# Patient Record
Sex: Male | Born: 2000 | Race: Black or African American | Hispanic: No | Marital: Single | Smoking: Former smoker
Health system: Southern US, Community
[De-identification: ages and names within clinical notes are randomized; demographics above are authoritative.]

## PROBLEM LIST (undated history)

## (undated) DIAGNOSIS — K219 Gastro-esophageal reflux disease without esophagitis: Secondary | ICD-10-CM

## (undated) DIAGNOSIS — T7840XA Allergy, unspecified, initial encounter: Secondary | ICD-10-CM

## (undated) DIAGNOSIS — H6690 Otitis media, unspecified, unspecified ear: Secondary | ICD-10-CM

## (undated) DIAGNOSIS — J45909 Unspecified asthma, uncomplicated: Secondary | ICD-10-CM

## (undated) DIAGNOSIS — J302 Other seasonal allergic rhinitis: Secondary | ICD-10-CM

## (undated) DIAGNOSIS — J189 Pneumonia, unspecified organism: Secondary | ICD-10-CM

## (undated) HISTORY — DX: Allergy, unspecified, initial encounter: T78.40XA

## (undated) HISTORY — DX: Pneumonia, unspecified organism: J18.9

## (undated) HISTORY — DX: Unspecified asthma, uncomplicated: J45.909

## (undated) HISTORY — PX: CIRCUMCISION: SUR203

## (undated) HISTORY — PX: TONSILLECTOMY AND ADENOIDECTOMY: SHX28

## (undated) HISTORY — DX: Gastro-esophageal reflux disease without esophagitis: K21.9

## (undated) HISTORY — PX: TONSILLECTOMY: SUR1361

## (undated) HISTORY — DX: Other seasonal allergic rhinitis: J30.2

## (undated) HISTORY — DX: Otitis media, unspecified, unspecified ear: H66.90

---
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
## 2021-01-11 ENCOUNTER — Other Ambulatory Visit: Payer: Self-pay

## 2021-01-11 ENCOUNTER — Ambulatory Visit (INDEPENDENT_AMBULATORY_CARE_PROVIDER_SITE_OTHER): Payer: Medicaid Other | Admitting: Pediatrics

## 2021-01-11 ENCOUNTER — Encounter: Payer: Self-pay | Admitting: Pediatrics

## 2021-01-11 ENCOUNTER — Other Ambulatory Visit (HOSPITAL_COMMUNITY)
Admission: RE | Admit: 2021-01-11 | Discharge: 2021-01-11 | Disposition: A | Discharge status: Home / Self Care | Payer: Medicaid Other | Source: Ambulatory Visit | Attending: Pediatrics | Admitting: Pediatrics

## 2021-01-11 VITALS — BP 118/70 | HR 87 | Ht 72.0 in | Wt 247.2 lb

## 2021-01-11 DIAGNOSIS — E6609 Other obesity due to excess calories: Secondary | ICD-10-CM

## 2021-01-11 DIAGNOSIS — Z114 Encounter for screening for human immunodeficiency virus [HIV]: Secondary | ICD-10-CM | POA: Diagnosis not present

## 2021-01-11 DIAGNOSIS — F310 Bipolar disorder, current episode hypomanic: Secondary | ICD-10-CM | POA: Diagnosis not present

## 2021-01-11 DIAGNOSIS — J4520 Mild intermittent asthma, uncomplicated: Secondary | ICD-10-CM

## 2021-01-11 DIAGNOSIS — Z113 Encounter for screening for infections with a predominantly sexual mode of transmission: Secondary | ICD-10-CM | POA: Diagnosis present

## 2021-01-11 DIAGNOSIS — Z00121 Encounter for routine child health examination with abnormal findings: Secondary | ICD-10-CM

## 2021-01-11 DIAGNOSIS — Z68.41 Body mass index (BMI) pediatric, greater than or equal to 95th percentile for age: Secondary | ICD-10-CM

## 2021-01-11 LAB — POCT RAPID HIV: Rapid HIV, POC: NEGATIVE

## 2021-01-11 NOTE — Progress Notes (Signed)
Adolescent Well Care Visit Tom [name] is a 20 y.o. male who is here for well care.     PCP:  Lady Deutscher, MD   History was provided by the patient and mother.  Confidentiality was discussed with the patient and, if applicable, with caregiver.   Current Issues: Current concerns include  Weaning off abilify. Now on 5mg  (down from 10mg ); plan to come off as of aug 13. Still feeling very "clouded". Does not want to do therapy again.  Nutrition: Nutrition/Eating Behaviors: wide variety, sits at home a lot Adequate calcium in diet?: yes  Exercise/ Media: Play any Sports?:  football Exercise:  goes to gym ) Screen Time:  > 2 hours-counseling provided  Sleep:  Sleep: 10 hours  Social Screening: Lives with:  mom, brother Parental relations:   good Activities, Work, and ?: none currently, considering working at [employer] Concerns regarding behavior with peers?  no  Education: School Grade: finished  Patient has a dental home: yes   Confidential social history: Tobacco?  no Secondhand smoke exposure? no Drugs/ETOH?  no  Sexually Active?  no   Pregnancy Prevention: n/a  Safe at home, in school & in relationships? yes Safe to self?  Yes   Screenings:  The patient completed the Rapid Assessment for Adolescent Preventive Services screening questionnaire and the following topics were identified as risk factors and discussed: healthy eating, exercise, drug use, and condom use  In addition, the following topics were discussed as part of anticipatory guidance: pregnancy prevention, depression/anxiety.  PHQ-9 completed and results indicated 4  Physical Exam:  Vitals:   01/11/21 1337  BP: 118/70  Pulse: 87  SpO2: 99%  Weight: 247 lb 3.2 oz (112.1 kg)  Height: 6' (1.829 m)   BP 118/70 (BP Location: Right Arm, Patient Position: Sitting, Cuff Size: Normal)   Pulse 87   Ht 6' (1.829 m)   Wt 247 lb 3.2 oz (112.1 kg)   SpO2 99%   BMI 33.53 kg/m   Body mass index: body mass index is 33.53 kg/m. Blood pressure percentiles are not available for patients who are 18 years or older.  Hearing Screening  Method: Audiometry   500Hz  1000Hz  2000Hz  4000Hz   Right ear 20 20 20 20   Left ear 20 20 20 20    Vision Screening   Right eye Left eye Both eyes  Without correction 20/25 20/25 20/20   With correction       General: well developed, no acute distress, gait normal HEENT: PERRL, normal oropharynx, TMs normal bilaterally Neck: supple, no lymphadenopathy CV: RRR no murmur noted PULM: normal aeration throughout all lung fields, no crackles or wheezes Abdomen: soft, non-tender; no masses or HSM Extremities: warm and well perfused Skin: no rash Neuro: alert and oriented, moves all extremities equally   Assessment and Plan:  Tom [name] is a 20 y.o. male who is here for well care.   #Well teen: -BMI is not appropriate for age. Recommended continuing to increase exercise, decrease junk food consumption, monitor portion size  -Discussed anticipatory guidance including pregnancy/STI prevention, alcohol/drug use, safety in the car and around water -Screens: Hearing screening result:normal; Vision screening result: normal  #Need for vaccination:  -Counseling provided for all vaccine components  Orders Placed This Encounter  Procedures   POCT Rapid HIV    #Bipolar: - currently weaning off meds/therapy. Discussed that I think it is best to continue therapy.  #Asthma, well controlled - no refills needed.   6 mo f/u with  adult MD to establish care   Lady Deutscher, MD

## 2021-01-12 LAB — URINE CYTOLOGY ANCILLARY ONLY
Chlamydia: NEGATIVE
Comment: NEGATIVE
Comment: NORMAL
Neisseria Gonorrhea: NEGATIVE

## 2021-01-15 ENCOUNTER — Ambulatory Visit: Payer: Medicaid Other | Admitting: Pediatrics

## 2021-01-21 ENCOUNTER — Telehealth (HOSPITAL_COMMUNITY): Payer: Self-pay | Admitting: Psychiatry

## 2021-01-21 ENCOUNTER — Telehealth (HOSPITAL_COMMUNITY): Payer: Self-pay | Admitting: *Deleted

## 2021-01-21 NOTE — Telephone Encounter (Signed)
Call from patients mom stating she "has important information to share about his medicine" He was started on Abilify in July. Will forward her concern to his provider.

## 2021-01-21 NOTE — Telephone Encounter (Signed)
Patient note he has 8 pills (Abilify 5 mg) left and was wondering if it was okay for him to discontinue it.  Writer asked patient if he had increased depression, irritability or symptoms of mania.  He notes that he was feeling fine and would like to discontinue medications after he runs out.  Provider endorsed understanding and informed patient that he can discontinue the medication after he runs out of them.  He endorsed understanding and agreed.  No other concerns noted at this time.

## 2021-01-21 NOTE — Telephone Encounter (Signed)
Provider spoke to patient notes that he is running out of Abilify 5mg  tablets.  He informed that he has 8 pills left I was wondering if it was okay for him to discontinue it after he runs out.  Provider asked patient if he was having symptoms of mania, symptoms of psychosis, or increased depression.  He notes that he feels fine.  Provider informed patient that he can discontinue the medication after he runs out.  Provider instructed patient to call clinic if side effects occur.  He endorsed understanding and agreed.  No other concerns noted at this time

## 2021-02-10 ENCOUNTER — Ambulatory Visit: Payer: Self-pay | Admitting: Pediatrics

## 2021-02-16 ENCOUNTER — Other Ambulatory Visit: Payer: Self-pay

## 2021-02-16 ENCOUNTER — Ambulatory Visit (HOSPITAL_COMMUNITY)
Admission: EM | Admit: 2021-02-16 | Discharge: 2021-02-16 | Disposition: A | Discharge status: Home / Self Care | Payer: Medicaid Other | Attending: Physician Assistant | Admitting: Physician Assistant

## 2021-02-16 ENCOUNTER — Encounter (HOSPITAL_COMMUNITY): Payer: Self-pay | Admitting: Emergency Medicine

## 2021-02-16 ENCOUNTER — Ambulatory Visit (INDEPENDENT_AMBULATORY_CARE_PROVIDER_SITE_OTHER): Payer: Medicaid Other

## 2021-02-16 DIAGNOSIS — R0602 Shortness of breath: Secondary | ICD-10-CM

## 2021-02-16 DIAGNOSIS — U071 COVID-19: Secondary | ICD-10-CM | POA: Insufficient documentation

## 2021-02-16 LAB — POCT RAPID STREP A, ED / UC: Streptococcus, Group A Screen (Direct): NEGATIVE

## 2021-02-16 LAB — SARS CORONAVIRUS 2 (TAT 6-24 HRS): SARS Coronavirus 2: POSITIVE — AB

## 2021-02-16 MED ORDER — ACETAMINOPHEN 325 MG PO TABS
650.0000 mg | ORAL_TABLET | Freq: Once | ORAL | Status: AC
  Administered 2021-02-16: 650 mg via ORAL

## 2021-02-16 MED ORDER — ACETAMINOPHEN 325 MG PO TABS
ORAL_TABLET | ORAL | Status: AC
  Filled 2021-02-16: qty 2

## 2021-02-16 NOTE — ED Triage Notes (Signed)
Pt reports that his brother has Covid. Reports abd pains, fevers, nausea and fatigue since Sunday. Denies vomiting or diarrhea.

## 2021-02-16 NOTE — Discharge Instructions (Addendum)
Remain in isolation

## 2021-02-16 NOTE — ED Provider Notes (Signed)
[facility]    CSN: [id number] Arrival date & time: 02/16/21  0277      History   Chief Complaint Chief Complaint  Patient presents with   Abdominal Pain   Fever    HPI Tom [name] is a 20 y.o. male.   Patient here c/w cough x 3 days.  Brother sick with COVID at home.  He admits f/c, fatigue, congestion, cough, SOB.  He has not had COVID vaccine.  He has not taking any medications for this.   Past Medical History:  Diagnosis Date   Allergy    Asthma, chronic 10/30/2012   Concussion without loss of consciousness 03/18/2017   GERD (gastroesophageal reflux disease) 05/29/06   Osgood-Schlatter's disease of left lower extremity 01/15/2015   Otitis media    Pneumonia 06/02/03   Seasonal allergic rhinitis 10/30/2012    Patient Active Problem List   Diagnosis Date Noted   Anxiety state 08/05/2020   Bipolar I disorder, current or most recent episode hypomanic (HCC) 07/16/2020   Psychoactive substance-induced psychosis (HCC) 05/16/2020   Psychosis (HCC) 05/14/2020   Abnormal vision screen 12/17/2018   Influenza vaccine refused 12/17/2018   Reading disability, 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 Authorizing Provider  ARIPiprazole (ABILIFY) 5 MG tablet Take 2 tablets (10 mg total) by mouth daily. 12/30/20   Shanna Cisco, NP  cetirizine (ZYRTEC) 10 MG tablet Take 1 tablet (10 mg total) by mouth daily. 10/08/20 01/06/21  Collene Gobble I, MD  mupirocin ointment (BACTROBAN) 2 % Apply 1 application topically 2 (two) times daily. Patient not taking: Reported on 01/11/2021 12/09/20   Lady Deutscher, MD  [facility] HFA [id number] [phone] Base) MCG/ACT inhaler INHALE 2 PUFFS INTO THE LUNGS EVERY 4  HOURS AS NEEDED FOR WHEEZING OR SHORTNESS OF BREATH 12/11/20   Ettefagh, Aron Baba, MD  montelukast (SINGULAIR) 10 MG tablet Take 1 tablet (10 mg total) by mouth at bedtime. Patient not taking: Reported on 01/16/2020 12/12/18 02/20/20  Cori Razor, MD    Family History Family History  Problem Relation Age of Onset   Asthma Mother    Learning disabilities Sister    Asthma Brother    Learning disabilities Brother    Asthma Maternal Grandmother    Heart disease Maternal Grandmother    Diabetes Maternal Grandmother    Kidney disease Maternal Grandmother    Heart disease Paternal Grandmother    Healthy Father    Bipolar disorder Maternal Aunt     Social History Social History   Tobacco Use   Smoking status: Former    Types: E-cigarettes   Smokeless tobacco: Never  Vaping Use   Vaping Use: Former   Substances: Nicotine, Flavoring  Substance Use Topics   Alcohol use: No   Drug use: Yes    Types: Marijuana    Comment: last use 1/23     Allergies   Lactose intolerance (gi)   Review of Systems Review of Systems  Constitutional:  Positive for chills, fatigue and fever.  HENT:  Positive for congestion and rhinorrhea. Negative for ear pain, nosebleeds, postnasal drip, sinus pressure, sinus pain and sore throat.   Eyes:  Negative for pain and  redness.  Respiratory:  Positive for cough and shortness of breath. Negative for wheezing.   Gastrointestinal:  Positive for abdominal pain and nausea. Negative for diarrhea and vomiting.  Musculoskeletal:  Positive for myalgias. Negative for arthralgias.  Skin:  Negative for rash.  Neurological:  Positive for headaches. Negative for light-headedness.  Hematological:  Negative for adenopathy. Does not bruise/bleed easily.  Psychiatric/Behavioral:  Negative for confusion and sleep disturbance.     Physical Exam Triage Vital Signs ED Triage Vitals  Enc Vitals Group     BP 02/16/21 0903 131/68     Pulse Rate 02/16/21 0903 (!)  125     Resp --      Temp 02/16/21 0903 (!) 102.9 F (39.4 C)     Temp Source 02/16/21 0903 Oral     SpO2 02/16/21 0903 99 %     Weight --      Height --      Head Circumference --      Peak Flow --      Pain Score 02/16/21 0910 8     Pain Loc --      Pain Edu? --      Excl. in GC? --    No data found.  Updated Vital Signs BP 131/68 (BP Location: Right Arm)   Pulse (!) 125   Temp (!) 102.9 F (39.4 C) (Oral)   SpO2 99%   Visual Acuity Right Eye Distance:   Left Eye Distance:   Bilateral Distance:    Right Eye Near:   Left Eye Near:    Bilateral Near:     Physical Exam Vitals and nursing note reviewed.  Constitutional:      General: He is not in acute distress.    Appearance: Normal appearance. He is not ill-appearing.  HENT:     Head: Normocephalic and atraumatic.     Right Ear: Tympanic membrane and ear canal normal.     Left Ear: Tympanic membrane and ear canal normal.     Nose: No congestion or rhinorrhea.     Mouth/Throat:     Pharynx: No oropharyngeal exudate or posterior oropharyngeal erythema.  Eyes:     General: No scleral icterus.    Extraocular Movements: Extraocular movements intact.     Conjunctiva/sclera: Conjunctivae normal.  Cardiovascular:     Rate and Rhythm: Regular rhythm. Tachycardia present.     Heart sounds: No murmur heard. Pulmonary:     Effort: Pulmonary effort is normal. No respiratory distress.     Breath sounds: Normal breath sounds. No wheezing or rales.     Comments: Speaking in full and complete sentences  Musculoskeletal:     Cervical back: Normal range of motion. No rigidity.  Skin:    Capillary Refill: Capillary refill takes less than 2 seconds.     Coloration: Skin is not jaundiced.     Findings: No rash.  Neurological:     General: No focal deficit present.     Mental Status: He is alert and oriented to person, place, and time.     Motor: No weakness.     Gait: Gait normal.  Psychiatric:        Mood and Affect:  Mood normal.        Behavior: Behavior normal.     UC Treatments / Results  Labs (all labs ordered are listed, but only abnormal results are displayed) Labs Reviewed  SARS CORONAVIRUS 2 (TAT 6-24 HRS)  POCT RAPID STREP A, ED / UC  EKG   Radiology DG Chest 2 View  Result Date: 02/16/2021 CLINICAL DATA:  SOB, exposed to COVID EXAM: CHEST - 2 VIEW COMPARISON:  None. FINDINGS: The cardiomediastinal silhouette is within normal limits. No pleural effusion. No pneumothorax. No mass or consolidation. No acute osseous abnormality. IMPRESSION: No acute abnormality in the chest. Electronically Signed   By: Olive [name] M.D.   On: 02/16/2021 10:16    Procedures Procedures (including critical care time)  Medications Ordered in UC Medications  acetaminophen (TYLENOL) tablet 650 mg (650 mg Oral Given 02/16/21 0917)    Initial Impression / Assessment and Plan / UC Course  I have reviewed the triage vital signs and the nursing notes.  Pertinent labs & imaging results that were available during my care of the patient were reviewed by me and considered in my medical decision making (see chart for details).     Take ibuprofen or tylenol as needed for fever, muscle pain ED precautions provided Brother has COVID< patient has COVID - like sx, very likely has COVID, test will be performed, but remain in isolation as discussed Final Clinical Impressions(s) / UC Diagnoses   Final diagnoses:  COVID-19 determined by clinical diagnostic criteria     Discharge Instructions      Remain in isolation     ED Prescriptions   None    PDMP not reviewed this encounter.   Evern Core, PA-C 02/16/21 1034

## 2021-02-19 ENCOUNTER — Telehealth (HOSPITAL_COMMUNITY): Payer: Self-pay | Admitting: Psychiatry

## 2021-02-19 NOTE — Telephone Encounter (Signed)
Provider spoke with patient who notes that he has been off of Abilify for 3 weeks and not quite feeling like him self. Provider asked patient to elaborate and he notes that he feels weird and unable to focus at times. He endorses having an adequate appetite and sleep. He denies SI/HI/VAH, mania, or paranoia. Provider asked patient if he felt more like himself when he was active, in school, and playing football. He notes that he did. Provider asked patient if he has been active recently and he notes that he has not. Provider encouraged more activity. Provider also asked patient if he has been getting out of his home and separating from his mother. He notes he has not. Provider recommend getting out of his house in increments such as 15, 20, 30, and 60 min. He endorsed understanding and agreed. Provider also asked patient if he has enrolled in college and he notes that he has not as of yet. Provider also encouraged patient to see his PCP if his symptoms did not improve. He endorsed understanding and agreed. No other concerns noted at this time.

## 2021-02-19 NOTE — Telephone Encounter (Signed)
Mother, [name], called requesting provider call patient on his cell [phone] to discuss meds. Per mother patient is having a "really har time right now and needs to speak to provider today.' Mother also says he does not answer his phone much so provider can call her phone if needed.

## 2021-02-25 ENCOUNTER — Encounter: Payer: Self-pay | Admitting: Internal Medicine

## 2021-02-25 ENCOUNTER — Other Ambulatory Visit: Payer: Self-pay

## 2021-02-25 ENCOUNTER — Ambulatory Visit (INDEPENDENT_AMBULATORY_CARE_PROVIDER_SITE_OTHER): Payer: Medicaid Other | Admitting: Internal Medicine

## 2021-02-25 DIAGNOSIS — F310 Bipolar disorder, current episode hypomanic: Secondary | ICD-10-CM | POA: Diagnosis present

## 2021-02-25 NOTE — Progress Notes (Signed)
CC: establish care  HPI:Mr.Tom [name] is a 20 y.o. male who presents for evaluation of establish care. Please see individual problem based A/P for details.  Please see encounters tab for problem-based charting.  Problem List Items Addressed This Visit       Other   Bipolar I disorder, current or most recent episode hypomanic [facility])    Patient accompanied by mother.  They reported change in his mentation which occurred last year in November after patient did a dab of K2 O.  They report no prior psychiatric problems.  After initial drug-induced psychosis in November he had another episode in December and 1 and January.  These episodes required hospitalization.  He was tried on fluoxetine, olanzapine, aripiprazole, bupropion.  These medications did not work.  Injectable Abilify 400 was found to improve his symptoms, he was then later transitioned to p.o. and has gradually decreased.  He is not currently taking any medications as he reports that the Abilify made him feel off, sleepy, difficulty concentrating, and some weight gain.  He stopped his Abilify roughly 1 month ago and is still reporting the symptoms.  He denies any recent episodes of psychosis, no feelings of irritability anxiety.  He is not currently seeing any therapist or counselor.  Patient's episodes of psychosis likely drug-induced.  Feel reassured that he has not yet relapsed since stopping medications, though we cannot rule out yet that there is any underlying psychiatric disorder.  If his symptoms return, we can then work him up for underlying psychiatric disorder.  Patient advised to follow-up with a counselor or therapist and provided a list of therapists accepting new patients.  Also reassured patient that the medications have likely long since washed out of his system.  His difficulty concentrating weight gain are likely due to his abrupt change and lifestyle which began after the initial episodes of psychosis.  He is no  longer exercising, his diet has abruptly worsened starting back in November, and he is not drinking as much water as he used to.  Advised patient to increase his water intake opt for healthier foods and to start exercising again as this will likely help with his reported symptoms.  Also reassured patient that he will be able to return to school and work a job with no limitation at this point in time..       Depression, PHQ-9: Based on the patients  Flowsheet Row Office Visit from 02/25/2021 in [facility]  PHQ-9 Total Score 19      score we have 19.  Past Medical History:  Diagnosis Date   Allergy    Asthma, chronic 10/30/2012   Concussion without loss of consciousness 03/18/2017   GERD (gastroesophageal reflux disease) 05/29/06   Osgood-Schlatter's disease of left lower extremity 01/15/2015   Otitis media    Pneumonia 06/02/03   Seasonal allergic rhinitis 10/30/2012   Review of Systems:   Review of Systems  Constitutional:  Positive for malaise/fatigue. Negative for chills, diaphoresis, fever and weight loss.  HENT: Negative.    Eyes: Negative.   Respiratory: Negative.    Gastrointestinal: Negative.   Neurological: Negative.   Psychiatric/Behavioral:  Negative for hallucinations and substance abuse. The patient is nervous/anxious.     Physical Exam: Vitals:   02/25/21 1032  BP: 137/75  Pulse: (!) 101  Temp: 98.6 F (37 C)  TempSrc: Oral  SpO2: 100%  Weight: 252 lb 3.2 oz (114.4 kg)  Height: 6' (1.829 m)  General: Alert and oriented no acute distress. HEENT: Conjunctiva nl , antiicteric sclerae, moist mucous membranes, no exudate or erythema Cardiovascular: Normal rate, regular rhythm.  No murmurs, rubs, or gallops Pulmonary : Equal breath sounds, No wheezes, rales, or rhonchi Abdominal: soft, nontender,  bowel sounds present Ext: No edema in lower extremities, no tenderness to palpation of lower extremities.  Psych: Thought content normal  patient responds appropriately.  Assessment & Plan:   See Encounters Tab for problem based charting.  Patient seen with Dr. Criselda Peaches

## 2021-02-25 NOTE — Patient Instructions (Addendum)
Dear Mr. Tom [name],  Thank you for trusting [place] with your care today.  Today we discussed the following: Psychotic episodes following K2O use. We feel that since you have not had any recent psychotic episodes, and are not requiring any medication, the episodes are likely related to that drug use. We will not start you on any medication at this time. As for many of the symptoms you are experiencing, including the trouble concentrating and weight gain, this can helped through lifestyle changes including increased water intake, improved diet, and increasing your daily exercise. A counselor or therapist can also help you as well, and I strongly recommend you schedule an appointment to meet with one.  You are doing well overall and there is no need for [place] to get blood work done today. Please follow up in one year for an annual visit.  If anything happens in the mean time, please give our office a call.

## 2021-02-25 NOTE — Assessment & Plan Note (Signed)
Patient accompanied by mother.  They reported change in his mentation which occurred last year in November after patient did a dab of K2 O.  They report no prior psychiatric problems.  After initial drug-induced psychosis in November he had another episode in December and 1 and January.  These episodes required hospitalization.  He was tried on fluoxetine, olanzapine, aripiprazole, bupropion.  These medications did not work.  Injectable Abilify 400 was found to improve his symptoms, he was then later transitioned to p.o. and has gradually decreased.  He is not currently taking any medications as he reports that the Abilify made him feel off, sleepy, difficulty concentrating, and some weight gain.  He stopped his Abilify roughly 1 month ago and is still reporting the symptoms.  He denies any recent episodes of psychosis, no feelings of irritability anxiety.  He is not currently seeing any therapist or counselor.  Patient's episodes of psychosis likely drug-induced.  Feel reassured that he has not yet relapsed since stopping medications, though we cannot rule out yet that there is any underlying psychiatric disorder.  If his symptoms return, we can then work him up for underlying psychiatric disorder.  Patient advised to follow-up with a counselor or therapist and provided a list of therapists accepting new patients.  Also reassured patient that the medications have likely long since washed out of his system.  His difficulty concentrating weight gain are likely due to his abrupt change and lifestyle which began after the initial episodes of psychosis.  He is no longer exercising, his diet has abruptly worsened starting back in November, and he is not drinking as much water as he used to.  Advised patient to increase his water intake opt for healthier foods and to start exercising again as this will likely help with his reported symptoms.  Also reassured patient that he will be able to return to school and work a job  with no limitation at this point in time.Marland Kitchen

## 2021-03-02 NOTE — Progress Notes (Signed)
Internal Medicine Clinic Attending  I saw and evaluated the patient.  I personally confirmed the key portions of the history and exam documented by Dr.  Burnice Logan  and I reviewed pertinent patient test results.  The assessment, diagnosis, and plan were formulated together and I agree with the documentation in the resident's note.   We had a very good conversation with patient and family. He remains with an elevated PHQ-9, but no further psychosis or mania.  His episode could be drug related or could be the harbinger of a psychiatric diagnosis in the future.  Overall, family and patient would prefer to stay off of medication at this time, do a trial of community college and "get life back on track."  I think this is reasonable.  If any issues, patient and family have our phone number to call and can seek out emergency psychiatric care in the future as well.

## 2021-03-10 ENCOUNTER — Ambulatory Visit: Payer: Medicaid Other | Admitting: Pediatrics

## 2021-04-01 ENCOUNTER — Telehealth (INDEPENDENT_AMBULATORY_CARE_PROVIDER_SITE_OTHER): Payer: Medicaid Other | Admitting: Psychiatry

## 2021-04-01 ENCOUNTER — Encounter (HOSPITAL_COMMUNITY): Payer: Self-pay | Admitting: Psychiatry

## 2021-04-01 ENCOUNTER — Other Ambulatory Visit: Payer: Self-pay

## 2021-04-01 DIAGNOSIS — F3175 Bipolar disorder, in partial remission, most recent episode depressed: Secondary | ICD-10-CM | POA: Diagnosis not present

## 2021-04-01 NOTE — Progress Notes (Signed)
BH MD/PA/NP OP Progress Note Virtual Visit via Telephone Note  I connected with Tom [name] on 04/01/21 at  9:30 AM EDT by telephone and verified that I am speaking with the correct person using two identifiers.  Location: Patient: home Provider: Clinic   I discussed the limitations, risks, security and privacy concerns of performing an evaluation and management service by telephone and the availability of in person appointments. I also discussed with the patient that there may be a patient responsible charge related to this service. The patient expressed understanding and agreed to proceed.   I provided 30 minutes of non-face-to-face time during this encounter.  04/01/2021 9:16 AM Tom [name]  MRN:  [id number]  Chief Complaint: "I got a job at [employer]"   HPI: 20 year old male seen today for follow up of psychiatric evaluation. He has a psychiatric history of bipolar I, psychoactive substance induced psychosis, depression. He is currently not managed on medications and notes that he is doing well.  Today patient exam was done over the phone. During exam he was pleasant, cooperative, and engaged in conversation. He informed Clinical research associate that he has been doing well. He notes that for the last week he started a new job at [employer] and finds enjoyment in his job. He notes that he has been more active and feeling more like himself. At times he notes he has difficulty concentrating but informed writer that he is able to cope with this and does not wan to restart medications. Patient has been off of Abilify since August. He notes that his mood is stable and reports that he has minimal anxiety and depression. Provider conducted GAD-7 today scoring 5, last visit scored 4. He notes that he worries about communicating with people again, getting things back to normal, and his concentration.  Provider also conducted PHQ-9 today scoring 9, last visit scored 11. Today he denies  SI/HI/VAH, mania or paranoia. He endorses adequate sleep and appetite.   No medications added today. Patient notes that he would like to follow up with provider in a few months to revaluate his mental health. No other concerns noted at this time.   Visit Diagnosis:    ICD-10-CM   1. Bipolar disorder, in partial remission, most recent episode depressed (HCC)  F31.75       Past Psychiatric History:  reading disability, psychoactive substance induced psychosis, bipolar 1 disorder, and depression  Past Medical History:  Past Medical History:  Diagnosis Date   Allergy    Asthma, chronic 10/30/2012   Concussion without loss of consciousness 03/18/2017   GERD (gastroesophageal reflux disease) 05/29/06   Osgood-Schlatter's disease of left lower extremity 01/15/2015   Otitis media    Pneumonia 06/02/03   Seasonal allergic rhinitis 10/30/2012    Past Surgical History:  Procedure Laterality Date   CIRCUMCISION  newborn   TONSILLECTOMY AND ADENOIDECTOMY      Family Psychiatric History: Maternal aunt Bipolar depression, maternal cousin schizophrenia, Paternal aunt depression  Family History:  Family History  Problem Relation Age of Onset   Asthma Mother    Learning disabilities Sister    Asthma Brother    Learning disabilities Brother    Asthma Maternal Grandmother    Heart disease Maternal Grandmother    Diabetes Maternal Grandmother    Kidney disease Maternal Grandmother    Heart disease Paternal Grandmother    Healthy Father    Bipolar disorder Maternal Aunt     Social History:  Social History   Socioeconomic History   Marital status: Single    Spouse name: Not on file   Number of children: 0   Years of education: Not on file   Highest education level: 11th grade  Occupational History   Occupation: [employer]/ [employer]  Tobacco Use   Smoking status: Former    Types: E-cigarettes   Smokeless tobacco: Never  Building services engineer Use: Former   Substances:  Nicotine, Flavoring  Substance and Sexual Activity   Alcohol use: No   Drug use: Yes    Types: Marijuana    Comment: last use 1/23   Sexual activity: Never  Other Topics Concern   Not on file  Social History Narrative   Lives with mom, siblings   Social Determinants of Health   Financial Resource Strain: Low Risk    Difficulty of Paying Living Expenses: Not hard at all  Food Insecurity: No Food Insecurity   Worried About Programme researcher, broadcasting/film/video in the Last Year: Never true   Ran Out of Food in the Last Year: Never true  Transportation Needs: No Transportation Needs   Lack of Transportation (Medical): No   Lack of Transportation (Non-Medical): No  Physical Activity: Sufficiently Active   Days of Exercise per Week: 4 days   Minutes of Exercise per Session: 50 min  Stress: Stress Concern Present   Feeling of Stress : To some extent  Social Connections: Moderately Isolated   Frequency of Communication with Friends and Family: More than three times a week   Frequency of Social Gatherings with Friends and Family: More than three times a week   Attends Religious Services: More than 4 times per year   Active Member of [employer] or Organizations: No   Attends Banker Meetings: Never   Marital Status: Never married    Allergies:  Allergies  Allergen Reactions   Lactose Intolerance (Gi) Other (See Comments)    Severe stomach cramps    Metabolic Disorder Labs: [name] Results  Component Value Date   HGBA1C 5.0 07/13/2020   MPG 96.8 07/13/2020   MPG 99.67 06/09/2020   No results found for: PROLACTIN [name] Results  Component Value Date   CHOL 103 07/13/2020   TRIG 50 07/13/2020   HDL 52 07/13/2020   CHOLHDL 2.0 07/13/2020   VLDL 10 07/13/2020   LDLCALC 41 07/13/2020   LDLCALC 40 06/09/2020   [name] Results  Component Value Date   TSH 0.742 07/13/2020   TSH 0.466 06/09/2020    Therapeutic Level Labs: No results found for: LITHIUM No results found for: VALPROATE No  components found for:  CBMZ  Current Medications: Current Outpatient Medications  Medication Sig Dispense Refill   ARIPiprazole (ABILIFY) 5 MG tablet Take 2 tablets (10 mg total) by mouth daily. 30 tablet 0   cetirizine (ZYRTEC) 10 MG tablet Take 1 tablet (10 mg total) by mouth daily. 90 tablet 11   mupirocin ointment (BACTROBAN) 2 % Apply 1 application topically 2 (two) times daily. (Patient not taking: Reported on 01/11/2021) 22 g 0   PROAIR HFA 108 (90 Base) MCG/ACT inhaler INHALE 2 PUFFS INTO THE LUNGS EVERY 4 HOURS AS NEEDED FOR WHEEZING OR SHORTNESS OF BREATH 25.5 g 2   No current facility-administered medications for this visit.     Musculoskeletal: Strength & Muscle Tone:  Unable to assess due to telephone visit Gait & Station:  Unable to assess due to telephone visit Patient leans: N/A  Psychiatric  Specialty Exam: Review of Systems  There were no vitals taken for this visit.There is no height or weight on file to calculate BMI.  General Appearance:  Unable to assess due to telephone visit  Eye Contact:   Unable to assess due to telephone visit  Speech:  Clear and Coherent and Normal Rate  Volume:  Normal  Mood:  Euthymic  Affect:  Appropriate and Congruent  Thought Process:  Coherent, Goal Directed, and Linear  Orientation:  Full (Time, Place, and Person)  Thought Content: WDL and Logical   Suicidal Thoughts:  No  Homicidal Thoughts:  No  Memory:  Immediate;   Good Recent;   Good Remote;   Good  Judgement:  Good  Insight:  Good  Psychomotor Activity:  Normal  Concentration:  Concentration: Good and Attention Span: Good  Recall:  Good  Fund of Knowledge: Good  Language: Good  Akathisia:  No  Handed:  Right  AIMS (if indicated): done  Assets:  Communication Skills Desire for Improvement Financial Resources/Insurance Housing Leisure Time Physical Health Social Support  ADL's:  Intact  Cognition: WNL  Sleep:  Good   Screenings: AIMS    Flowsheet Row  [facility]  AIMS Total Score 0 0      AUDIT    Flowsheet Row [facility]) [facility]  Alcohol Use Disorder Identification Test Final Score (AUDIT) 0 0      GAD-7    Flowsheet Row Video Visit from 04/01/2021 in [facility] Clinical Support from 12/30/2020 in [facility] Clinical Support from 12/01/2020 in [facility] Clinical Support from 09/30/2020 in [facility] Clinical Support from 08/05/2020 in [facility]  Total GAD-7 Score 5 4 9 3 10       PHQ2-9    Flowsheet Row Video Visit from 04/01/2021 in [facility] Office Visit from 02/25/2021 in [facility] Clinical Support from 12/30/2020 in [facility] Clinical Support from 12/01/2020 in [facility] Clinical Support from 09/30/2020 in [facility]  PHQ-2 Total Score 1 4 1 5  0  PHQ-9 Total Score 9 19 11 19 13       Flowsheet Row ED from 02/16/2021 in [facility] Clinical Support from 12/30/2020 in [facility] Clinical Support from 12/01/2020 in [facility]  C-SSRS RISK CATEGORY No Risk Error: Question 6 not populated Error: Q7 should not be populated when Q6 is No        Assessment and Plan: Patient notes that his anxiety, depression, mood has improved since his last visit.  No medications added today. Patient notes that he would like to follow up with provider in a few  months to revaluate his mental health  1. Bipolar disorder, in partial remission, most recent episode depressed (HCC)   Follow-up in 3 month  [facility], NP 04/01/2021, 9:16 AM

## 2021-04-06 ENCOUNTER — Telehealth (HOSPITAL_COMMUNITY): Payer: Self-pay | Admitting: Psychiatry

## 2021-04-06 NOTE — Telephone Encounter (Signed)
Patients mother called in needing records changed, gave number to medical records.

## 2021-07-06 ENCOUNTER — Encounter (HOSPITAL_COMMUNITY): Payer: Self-pay | Admitting: Psychiatry

## 2021-07-06 ENCOUNTER — Telehealth (INDEPENDENT_AMBULATORY_CARE_PROVIDER_SITE_OTHER): Payer: Medicaid Other | Admitting: Psychiatry

## 2021-07-06 DIAGNOSIS — F3178 Bipolar disorder, in full remission, most recent episode mixed: Secondary | ICD-10-CM | POA: Diagnosis not present

## 2021-07-06 NOTE — Progress Notes (Signed)
[place] MD/PA/NP OP Progress Note Virtual Visit via Telephone Note  I connected with Athena Masse on 07/06/21 at  1:00 PM EST by telephone and verified that I am speaking with the correct person using two identifiers.  Location: Patient: home Provider: Clinic   I discussed the limitations, risks, security and privacy concerns of performing an evaluation and management service by telephone and the availability of in person appointments. I also discussed with the patient that there may be a patient responsible charge related to this service. The patient expressed understanding and agreed to proceed.   I provided 30 minutes of non-face-to-face time during this encounter.  07/06/2021 1:16 PM ZYEIR BOYTER  MRN:  [id number]  Chief Complaint: "I am doing good" Per mother: "He has been doing good and keeping him away from the wrong crowds"   HPI: 21 year old male seen today for follow up of psychiatric evaluation. He has a psychiatric history of bipolar I, psychoactive substance induced psychosis, depression. He is currently not managed on medications and notes that he is doing well.  Today patient exam was done over the phone. During exam he was pleasant, cooperative, and engaged in conversation. He informed Probation officer that he has been doing good. He reports that his mood is stable and denies anxiety and depression. At times he notes he is worried about disclosing his psychiatric history to employers. Provider informed patient that he does not have to disclose  his full history and that he should not be discriminated against if he does. He endorsed understanding. He notes that he continues to work at [employer] but has applied to [employer] to go. Patient reports adequate sleep and appetite. Today he denies SI/HI/VAH, mania or paranoia.  No medications added today. Patient notes that he would like to follow up as needed. No other concerns noted at this time.   Visit Diagnosis:     ICD-10-CM   1. Bipolar disorder, in full remission, most recent episode mixed ([place])  F31.78       Past Psychiatric History:  reading disability, psychoactive substance induced psychosis, bipolar 1 disorder, and depression  Past Medical History:  Past Medical History:  Diagnosis Date   Allergy    Asthma, chronic 10/30/2012   Concussion without loss of consciousness 03/18/2017   GERD (gastroesophageal reflux disease) 05/29/06   Osgood-Schlatter's disease of left lower extremity 01/15/2015   Otitis media    Pneumonia 06/02/03   Seasonal allergic rhinitis 10/30/2012    Past Surgical History:  Procedure Laterality Date   CIRCUMCISION  newborn   TONSILLECTOMY AND ADENOIDECTOMY      Family Psychiatric History: Maternal aunt Bipolar depression, maternal cousin schizophrenia, Paternal aunt depression  Family History:  Family History  Problem Relation Age of Onset   Asthma Mother    Learning disabilities Sister    Asthma Brother    Learning disabilities Brother    Asthma Maternal Grandmother    Heart disease Maternal Grandmother    Diabetes Maternal Grandmother    Kidney disease Maternal Grandmother    Heart disease Paternal Grandmother    Healthy Father    Bipolar disorder Maternal Aunt     Social History:  Social History   Socioeconomic History   Marital status: Single    Spouse name: Not on file   Number of children: 0   Years of education: Not on file   Highest education level: 11th grade  Occupational History   Occupation: Scientist, clinical (histocompatibility and immunogenetics) [employer]  Tobacco Use   Smoking status: Former    Types: E-cigarettes   Smokeless tobacco: Never  Vaping Use   Vaping Use: Former   Substances: Nicotine, Flavoring  Substance and Sexual Activity   Alcohol use: No   Drug use: Yes    Types: Marijuana    Comment: last use 1/23   Sexual activity: Never  Other Topics Concern   Not on file  Social History Narrative   Lives with mom, siblings   Social Determinants of  Health   Financial Resource Strain: Not on file  Food Insecurity: Not on file  Transportation Needs: Not on file  Physical Activity: Not on file  Stress: Not on file  Social Connections: Not on file    Allergies:  Allergies  Allergen Reactions   Lactose Intolerance (Gi) Other (See Comments)    Severe stomach cramps    Metabolic Disorder Labs: Lab Results  Component Value Date   HGBA1C 5.0 07/13/2020   MPG 96.8 07/13/2020   MPG 99.67 06/09/2020   No results found for: PROLACTIN Lab Results  Component Value Date   CHOL 103 07/13/2020   TRIG 50 07/13/2020   HDL 52 07/13/2020   CHOLHDL 2.0 07/13/2020   VLDL 10 07/13/2020   LDLCALC 41 07/13/2020   LDLCALC 40 06/09/2020   Lab Results  Component Value Date   TSH 0.742 07/13/2020   TSH 0.466 06/09/2020    Therapeutic Level Labs: No results found for: LITHIUM No results found for: VALPROATE No components found for:  CBMZ  Current Medications: Current Outpatient Medications  Medication Sig Dispense Refill   ARIPiprazole (ABILIFY) 5 MG tablet Take 2 tablets (10 mg total) by mouth daily. 30 tablet 0   cetirizine (ZYRTEC) 10 MG tablet Take 1 tablet (10 mg total) by mouth daily. 90 tablet 11   mupirocin ointment (BACTROBAN) 2 % Apply 1 application topically 2 (two) times daily. (Patient not taking: Reported on 01/11/2021) 22 g 0   PROAIR HFA 108 (90 Base) MCG/ACT inhaler INHALE 2 PUFFS INTO THE LUNGS EVERY 4 HOURS AS NEEDED FOR WHEEZING OR SHORTNESS OF BREATH 25.5 g 2   No current facility-administered medications for this visit.     Musculoskeletal: Strength & Muscle Tone:  Unable to assess due to telephone visit Gait & Station:  Unable to assess due to telephone visit Patient leans: N/A  Psychiatric Specialty Exam: Review of Systems  There were no vitals taken for this visit.There is no height or weight on file to calculate BMI.  General Appearance:  Unable to assess due to telephone visit  Eye Contact:   Unable  to assess due to telephone visit  Speech:  Clear and Coherent and Normal Rate  Volume:  Normal  Mood:  Euthymic  Affect:  Appropriate and Congruent  Thought Process:  Coherent, Goal Directed, and Linear  Orientation:  Full (Time, Place, and Person)  Thought Content: WDL and Logical   Suicidal Thoughts:  No  Homicidal Thoughts:  No  Memory:  Immediate;   Good Recent;   Good Remote;   Good  Judgement:  Good  Insight:  Good  Psychomotor Activity:  Normal  Concentration:  Concentration: Good and Attention Span: Good  Recall:  Good  Fund of Knowledge: Good  Language: Good  Akathisia:  No  Handed:  Right  AIMS (if indicated): done  Assets:  Communication Skills Desire for Improvement Financial Resources/Insurance Housing Leisure Time Physical Health Social Support  ADL's:  Intact  Cognition: WNL  Sleep:  Good   Screenings: AIMS    Flowsheet Row Admission (Discharged) from 07/13/2020 in [place] Admission (Discharged) from 06/09/2020 in [place] [facility]  AIMS Total Score 0 0      AUDIT    Flowsheet Row Admission (Discharged) from 07/13/2020 in [place] Admission (Discharged) from 06/09/2020 in [place] [facility]  Alcohol Use Disorder Identification Test Final Score (AUDIT) 0 0      GAD-7    Flowsheet Row Video Visit from 04/01/2021 in [place] from 12/30/2020 in [place] from 12/01/2020 in [place] from 09/30/2020 in [place] from 08/05/2020 in [facility]  Total GAD-7 Score 5 4 9 3 10       [id number]    Flowsheet Row Video Visit from 04/01/2021 in [facility] Office Visit from 02/25/2021 in  [place] from 12/30/2020 in [place] from 12/01/2020 in [place] from 09/30/2020 in [place]  PHQ-2 Total Score 1 4 1 5  0  PHQ-9 Total Score 9 19 11 19 13       Flowsheet Row ED from 02/16/2021 in [facility] from 12/30/2020 in [place] from 12/01/2020 in [place] No Risk Error: Question 6 not populated Error: Q7 should not be populated when Q6 is No        Assessment and Plan: Patient notes that his anxiety, depression, mood has improved since his last visit.  No medications added today. Patient will follow up if need.  1. Bipolar disorder, in full remission, most recent episode mixed ([place])     Follow-up in 3 month  Salley Slaughter, NP 07/06/2021, 1:16 PM

## 2022-01-31 ENCOUNTER — Other Ambulatory Visit: Payer: Self-pay

## 2022-01-31 ENCOUNTER — Encounter (HOSPITAL_COMMUNITY): Payer: Self-pay

## 2022-01-31 ENCOUNTER — Emergency Department (HOSPITAL_COMMUNITY)
Admission: EM | Admit: 2022-01-31 | Discharge: 2022-01-31 | Disposition: A | Discharge status: Home / Self Care | Payer: Medicaid Other | Attending: Emergency Medicine | Admitting: Emergency Medicine

## 2022-01-31 DIAGNOSIS — S0990XA Unspecified injury of head, initial encounter: Secondary | ICD-10-CM | POA: Insufficient documentation

## 2022-01-31 DIAGNOSIS — Y92831 Amusement park as the place of occurrence of the external cause: Secondary | ICD-10-CM | POA: Insufficient documentation

## 2022-01-31 DIAGNOSIS — W228XXA Striking against or struck by other objects, initial encounter: Secondary | ICD-10-CM | POA: Insufficient documentation

## 2022-01-31 NOTE — ED Notes (Signed)
Patient ambulated with steady gait back to hallway A.

## 2022-01-31 NOTE — ED Triage Notes (Signed)
[place] and hit his head on a slide yesterday. Pt complains of headaches and dizziness.

## 2022-01-31 NOTE — ED Provider Notes (Signed)
[facility]-EMERGENCY DEPT Provider Note   CSN: [id number] Arrival date & time: 01/31/22  1941     History  Chief Complaint  Patient presents with   Head Injury    Tom [name] is a 21 y.o. male who presents to the ED with concerns for head injury onset yesterday.  Patient notes that he was at a water park yesterday while going down a slide when he hit his head on the side of the slide.  Notes that he had a headache as well as dizziness at the time.  Also notes that he was able to continue at the water park without difficulty.  Patient reports his symptoms are improved at this time.  Denies LOC, vomiting, nausea, neck pain, back pain.  The history is provided by the patient. No language interpreter was used.       Home Medications Prior to Admission medications   Medication Sig Start Date End Date Taking? Authorizing Provider  ARIPiprazole (ABILIFY) 5 MG tablet Take 2 tablets (10 mg total) by mouth daily. 12/30/20   Shanna Cisco, NP  cetirizine (ZYRTEC) 10 MG tablet Take 1 tablet (10 mg total) by mouth daily. 10/08/20 01/06/21  Collene Gobble I, MD  mupirocin ointment (BACTROBAN) 2 % Apply 1 application topically 2 (two) times daily. Patient not taking: Reported on 01/11/2021 12/09/20   Lady Deutscher, MD  [facility] HFA 108 [phone] Base) MCG/ACT inhaler INHALE 2 PUFFS INTO THE LUNGS EVERY 4 HOURS AS NEEDED FOR WHEEZING OR SHORTNESS OF BREATH 12/11/20   Ettefagh, Aron Baba, MD  montelukast (SINGULAIR) 10 MG tablet Take 1 tablet (10 mg total) by mouth at bedtime. Patient not taking: Reported on 01/16/2020 12/12/18 02/20/20  Cori Razor, MD      Allergies    Lactose intolerance (gi)    Review of Systems   Review of Systems  Gastrointestinal:  Negative for nausea and vomiting.  Musculoskeletal:  Negative for back pain and neck pain.  Neurological:  Positive for dizziness (resolved). Negative for syncope.  All other systems reviewed and are  negative.   Physical Exam Updated Vital Signs BP 134/81 (BP Location: Left Arm)   Pulse 80   Temp 99.2 F (37.3 C) (Oral)   Resp 18   Ht 6' (1.829 m)   Wt 96.6 kg   SpO2 99%   BMI 28.89 kg/m  Physical Exam Vitals and nursing note reviewed.  Constitutional:      General: He is not in acute distress.    Appearance: He is not diaphoretic.  HENT:     Head: Normocephalic and atraumatic.     Mouth/Throat:     Pharynx: No oropharyngeal exudate.  Eyes:     General: No scleral icterus.    Conjunctiva/sclera: Conjunctivae normal.  Cardiovascular:     Rate and Rhythm: Normal rate and regular rhythm.     Pulses: Normal pulses.     Heart sounds: Normal heart sounds.  Pulmonary:     Effort: Pulmonary effort is normal. No respiratory distress.     Breath sounds: Normal breath sounds. No wheezing.  Abdominal:     General: Bowel sounds are normal.     Palpations: Abdomen is soft. There is no mass.     Tenderness: There is no abdominal tenderness. There is no guarding or rebound.  Musculoskeletal:        General: Normal range of motion.     Cervical back: Normal range of motion and neck supple.  Comments: No C, T, L, S spinal tenderness to palpation.  No tenderness to palpation noted to musculature of back.  Able to ambulate without assistance or difficulty.  Skin:    General: Skin is warm and dry.  Neurological:     General: No focal deficit present.     Mental Status: He is alert.     Cranial Nerves: Cranial nerves 2-12 are intact.     Sensory: Sensation is intact.     Motor: Motor function is intact. No pronator drift.     Coordination: Coordination is intact.     Gait: Gait is intact.     Comments: Negative pronator drift.  No focal neurological deficits.  Grip strength 5/5 bilaterally.  Strength sensation intact to bilateral upper and lower extremities.  Psychiatric:        Behavior: Behavior normal.     ED Results / Procedures / Treatments   Labs (all labs ordered  are listed, but only abnormal results are displayed) Labs Reviewed - No data to display  EKG None  Radiology No results found.  Procedures Procedures    Medications Ordered in ED Medications - No data to display  ED Course/ Medical Decision Making/ A&P Clinical Course as of 01/31/22 2121  Mon Jan 31, 2022  2120 Offered patient Tylenol ibuprofen prior to discharge, patient declined at this time.  Patient notes that he would like a work note.  Work note provided.  Discussed discharge treatment plan with patient at bedside.  Patient appears safe for discharge at this time. [SB]    Clinical Course User Index [SB] Corleen Otwell A, PA-C                           Medical Decision Making  Pt presents with concerns for head injury onset yesterday while at a water park.  Patient was able to continue with his day at the water park after hitting his head on a slide while going down it.  Notes his symptoms are slightly improved today.  No meds tried prior to arrival.  Vital signs stable, patient afebrile, not tachycardic or hypoxic. On exam, pt with Negative pronator drift.  No focal neurological deficits.  Grip strength 5/5 bilaterally.  Strength sensation intact to bilateral upper and lower extremities. No C, T, L, S spinal tenderness to palpation.  No tenderness to palpation noted to musculature of back.  Able to ambulate without assistance or difficulty. No acute cardiovascular, respiratory, abdominal exam findings. Differential diagnosis includes concussion, intracranial abnormality.    Social Determinants of Health: Patient does not have a primary care provider at this time.  Disposition: Presentation suspicious for minor head injury, likely concussion.  Doubt intracranial abnormality at this time, patient not on anticoagulants, negative neurological exam today in the ED.  After consideration of the diagnostic results and the patients response to treatment, I feel that the patient would  benefit from Discharge home.  Work note provided today.  Supportive care measures and strict return precautions discussed with patient at bedside. Pt acknowledges and verbalizes understanding. Pt appears safe for discharge. Follow up as indicated in discharge paperwork.    This chart was dictated using voice recognition software, Dragon. Despite the best efforts of this provider to proofread and correct errors, errors may still occur which can change documentation meaning.  Final Clinical Impression(s) / ED Diagnoses Final diagnoses:  Minor head injury without loss of consciousness, initial encounter    Rx /  DC Orders ED Discharge Orders     None         Matther Labell A, PA-C 01/31/22 2226    Terrilee Files, MD 02/01/22 1112

## 2022-01-31 NOTE — Discharge Instructions (Addendum)
It was a pleasure taking care of you today!  It is important to ensure to maintain fluid intake.  It is important to decrease screen time for the next week as it can make your symptoms worse.  You may take over-the-counter 600 mg ibuprofen every 6 hours or 5 mg Tylenol every 6 hours as needed for pain for no more than 7 days.  You may follow-up with your primary care provider as needed.  Return to the ED if you are experiencing increasing/worsening headache, dizziness, lightheadedness, vomiting, worsening symptoms.

## 2022-06-14 ENCOUNTER — Ambulatory Visit: Admit: 2022-06-14 | Payer: Self-pay | Source: Home / Self Care

## 2022-06-14 ENCOUNTER — Ambulatory Visit
Admission: EM | Admit: 2022-06-14 | Discharge: 2022-06-14 | Disposition: A | Discharge status: Home / Self Care | Payer: Medicaid Other | Attending: Emergency Medicine | Admitting: Emergency Medicine

## 2022-06-14 DIAGNOSIS — J4521 Mild intermittent asthma with (acute) exacerbation: Secondary | ICD-10-CM

## 2022-06-14 DIAGNOSIS — J302 Other seasonal allergic rhinitis: Secondary | ICD-10-CM | POA: Diagnosis not present

## 2022-06-14 DIAGNOSIS — J3089 Other allergic rhinitis: Secondary | ICD-10-CM | POA: Diagnosis not present

## 2022-06-14 DIAGNOSIS — Z20828 Contact with and (suspected) exposure to other viral communicable diseases: Secondary | ICD-10-CM | POA: Diagnosis not present

## 2022-06-14 MED ORDER — PROMETHAZINE-DM 6.25-15 MG/5ML PO SYRP: 5.0000 mL | ORAL_SOLUTION | Freq: Four times a day (QID) | ORAL | 0 refills | Status: DC | PRN

## 2022-06-14 MED ORDER — GUAIFENESIN 400 MG PO TABS: ORAL_TABLET | ORAL | 0 refills | Status: DC

## 2022-06-14 MED ORDER — ALBUTEROL SULFATE HFA 108 (90 BASE) MCG/ACT IN AERS: 2.0000 | INHALATION_SPRAY | Freq: Four times a day (QID) | RESPIRATORY_TRACT | 0 refills | Status: AC | PRN

## 2022-06-14 MED ORDER — METHYLPREDNISOLONE 8 MG PO TABS: 32.0000 mg | ORAL_TABLET | Freq: Every day | ORAL | 0 refills | Status: AC

## 2022-06-14 MED ORDER — MONTELUKAST SODIUM 10 MG PO TABS: 10.0000 mg | ORAL_TABLET | Freq: Every day | ORAL | 1 refills | Status: DC

## 2022-06-14 MED ORDER — OSELTAMIVIR PHOSPHATE 75 MG PO CAPS: 75.0000 mg | ORAL_CAPSULE | Freq: Two times a day (BID) | ORAL | 0 refills | Status: AC

## 2022-06-14 MED ORDER — CETIRIZINE HCL 10 MG PO TABS: 10.0000 mg | ORAL_TABLET | Freq: Every day | ORAL | 1 refills | Status: DC

## 2022-06-14 MED ORDER — ONDANSETRON 4 MG PO TBDP: 4.0000 mg | ORAL_TABLET | Freq: Three times a day (TID) | ORAL | 0 refills | Status: DC | PRN

## 2022-06-14 MED ORDER — ALBUTEROL SULFATE (2.5 MG/3ML) 0.083% IN NEBU
2.5000 mg | INHALATION_SOLUTION | Freq: Once | RESPIRATORY_TRACT | Status: AC
  Administered 2022-06-14: 2.5 mg via RESPIRATORY_TRACT

## 2022-06-14 NOTE — ED Triage Notes (Signed)
Pt present coughing with fever and body aches. Symptoms started three days ago. Pt tried otc medication with no relief.

## 2022-06-14 NOTE — ED Provider Notes (Signed)
[facility]    CSN: [id number] Arrival date & time: 06/14/22  1655    HISTORY   Chief Complaint  Patient presents with   Cough   Generalized Body Aches   HPI Tom [name] is a pleasant, 21 y.o. male who presents to urgent care today. Patient reports a 2-day history of cough, subjective fever and bodyaches, states he cannot breathe.  Patient states he tried some over-the-counter cold and flu medications without meaningful relief.  Patient has normal vital signs on arrival today and appears to be in no acute distress.  EMR reviewed, patient has a history of allergies and asthma, not currently taking medications for either issue.  Patient states he was supposed to influenza, his sister tested positive for influenza 2 days ago.  The history is provided by the patient.   Past Medical History:  Diagnosis Date   Allergy    Asthma, chronic 10/30/2012   Concussion without loss of consciousness 03/18/2017   GERD (gastroesophageal reflux disease) 05/29/06   Osgood-Schlatter's disease of left lower extremity 01/15/2015   Otitis media    Pneumonia 06/02/03   Seasonal allergic rhinitis 10/30/2012   Patient Active Problem List   Diagnosis Date Noted   Bipolar disorder, in full remission, most recent episode mixed (HCC) 07/06/2021   Bipolar disorder, in partial remission, most recent episode depressed (HCC) 04/01/2021   Anxiety state 08/05/2020   Bipolar I disorder, current or most recent episode hypomanic (HCC) 07/16/2020   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 Authorizing Provider  ARIPiprazole (ABILIFY) 5 MG tablet Take 2 tablets (10 mg total) by mouth daily. 12/30/20   Shanna Cisco, NP  cetirizine (ZYRTEC) 10 MG tablet Take 1 tablet (10 mg total) by mouth daily. 10/08/20 01/06/21  Collene Gobble I, MD  mupirocin ointment (BACTROBAN) 2 % Apply 1 application topically 2 (two) times daily. Patient not taking: Reported on 01/11/2021 12/09/20   Lady Deutscher, MD  [facility] HFA 108 [phone] Base) MCG/ACT inhaler INHALE 2 PUFFS INTO THE LUNGS EVERY 4 HOURS AS NEEDED FOR WHEEZING OR SHORTNESS OF BREATH 12/11/20   Ettefagh, Aron Baba, MD  montelukast (SINGULAIR) 10 MG tablet Take 1 tablet (10 mg total) by mouth at bedtime. Patient not taking: Reported on 01/16/2020 12/12/18 02/20/20  Cori Razor, MD    Family History Family History  Problem Relation Age of Onset   Asthma Mother    Learning disabilities Sister    Asthma Brother    Learning disabilities Brother    Asthma Maternal Grandmother    Heart disease Maternal Grandmother    Diabetes Maternal Grandmother    Kidney disease Maternal Grandmother    Heart disease Paternal Grandmother    Healthy Father    Bipolar disorder Maternal Aunt    Social History Social History   Tobacco Use   Smoking status: Former    Types: E-cigarettes   Smokeless tobacco: Never  Vaping Use   Vaping Use: Former   Substances: Nicotine, Flavoring  Substance Use Topics   Alcohol use: No  Drug use: Yes    Types: Marijuana    Comment: last use 1/23   Allergies   Lactose intolerance (gi)  Review of Systems Review of Systems Pertinent findings revealed after performing a 14 point review of systems has been noted in the history of present illness.  Physical Exam Triage Vital Signs ED Triage Vitals  Enc Vitals Group     BP 04/16/21 0827 (!) 147/82     Pulse Rate 04/16/21 0827 72     Resp 04/16/21 0827 18     Temp 04/16/21 0827 98.3 F (36.8 C)      Temp Source 04/16/21 0827 Oral     SpO2 04/16/21 0827 98 %     Weight --      Height --      Head Circumference --      Peak Flow --      Pain Score 04/16/21 0826 5     Pain Loc --      Pain Edu? --      Excl. in GC? --   No data found.  Updated Vital Signs BP 112/68 (BP Location: Left Arm)   Pulse 99   Temp 98.5 F (36.9 C) (Oral)   Resp 18   SpO2 97%   Physical Exam Constitutional:      Appearance: He is ill-appearing.  HENT:     Head: Normocephalic and atraumatic.     Salivary Glands: Right salivary gland is not diffusely enlarged or tender. Left salivary gland is not diffusely enlarged or tender.     Right Ear: Tympanic membrane, ear canal and external ear normal.     Left Ear: Tympanic membrane, ear canal and external ear normal.     Nose: Congestion and rhinorrhea present. Rhinorrhea is clear.     Right Sinus: No maxillary sinus tenderness or frontal sinus tenderness.     Left Sinus: No maxillary sinus tenderness.     Mouth/Throat:     Mouth: Mucous membranes are moist.     Pharynx: Pharyngeal swelling, posterior oropharyngeal erythema and uvula swelling present.     Tonsils: No tonsillar exudate. 0 on the right. 0 on the left.  Cardiovascular:     Rate and Rhythm: Normal rate and regular rhythm.     Pulses: Normal pulses.  Pulmonary:     Effort: Pulmonary effort is normal. No accessory muscle usage, prolonged expiration or respiratory distress.     Breath sounds: No stridor. No wheezing, rhonchi or rales.     Comments: Turbulent breath sounds throughout without wheeze, rale, rhonchi. Abdominal:     General: Abdomen is flat. Bowel sounds are normal.     Palpations: Abdomen is soft.  Musculoskeletal:        General: Normal range of motion.  Lymphadenopathy:     Cervical: Cervical adenopathy present.     Right cervical: Superficial cervical adenopathy and posterior cervical adenopathy present.     Left cervical: Superficial cervical adenopathy and posterior  cervical adenopathy present.  Skin:    General: Skin is warm and dry.  Neurological:     General: No focal deficit present.     Mental Status: He is alert and oriented to person, place, and time.     Motor: Motor function is intact.     Coordination: Coordination is intact.     Gait: Gait is intact.     Deep Tendon Reflexes: Reflexes are normal and symmetric.  Psychiatric:        Attention and  Perception: Attention and perception normal.        Mood and Affect: Mood and affect normal.        Speech: Speech normal.        Behavior: Behavior normal. Behavior is cooperative.        Thought Content: Thought content normal.     Visual Acuity Right Eye Distance:   Left Eye Distance:   Bilateral Distance:    Right Eye Near:   Left Eye Near:    Bilateral Near:     UC Couse / Diagnostics / Procedures:     Radiology No results found.  Procedures Procedures (including critical care time) EKG  Pending results:  Labs Reviewed - No data to display  Medications Ordered in UC: Medications  albuterol (PROVENTIL) (2.5 MG/3ML) 0.083% nebulizer solution 2.5 mg (2.5 mg Nebulization Given 06/14/22 1947)    UC Diagnoses / Final Clinical Impressions(s)   I have reviewed the triage vital signs and the nursing notes.  Pertinent labs & imaging results that were available during my care of the patient were reviewed by me and considered in my medical decision making (see chart for details).    Final diagnoses:  Mild intermittent asthma with acute exacerbation  Perennial allergic rhinitis with seasonal variation  Exposure to influenza   Patient provided with postexposure prophylaxis for influenza.  Patient had improved work of breathing and breath sounds after nebulized albuterol treatment.  Patient provided with of allergy and asthma medications as well as a 3-day course of methylprednisolone.  Promethazine DM provided for nighttime cough. Please see discharge instructions below for  further details of plan of care. ED Prescriptions     Medication Sig Dispense Auth. Provider   cetirizine (ZYRTEC ALLERGY) 10 MG tablet Take 1 tablet (10 mg total) by mouth at bedtime. 90 tablet Theadora Rama Scales, PA-C   montelukast (SINGULAIR) 10 MG tablet Take 1 tablet (10 mg total) by mouth at bedtime. 90 tablet Theadora Rama Scales, PA-C   albuterol (VENTOLIN HFA) 108 (90 Base) MCG/ACT inhaler Inhale 2 puffs into the lungs every 6 (six) hours as needed for wheezing or shortness of breath (Cough). 18 g Theadora Rama Scales, PA-C   methylPREDNISolone (MEDROL) 8 MG tablet Take 4 tablets (32 mg total) by mouth daily for 3 days. 12 tablet Theadora Rama Scales, PA-C   oseltamivir (TAMIFLU) 75 MG capsule Take 1 capsule (75 mg total) by mouth every 12 (twelve) hours for 5 days. 10 capsule Theadora Rama Scales, PA-C   guaifenesin (HUMIBID E) 400 MG TABS tablet Take 1 tablet 3 times daily as needed for chest congestion and cough 21 tablet Theadora Rama Scales, PA-C   promethazine-dextromethorphan (PROMETHAZINE-DM) 6.25-15 MG/5ML syrup Take 5 mLs by mouth 4 (four) times daily as needed for cough. 118 mL Theadora Rama Scales, PA-C      PDMP not reviewed this encounter.  Disposition Upon Discharge:  Condition: stable for discharge home Home: take medications as prescribed; routine discharge instructions as discussed; follow up as advised.  Patient presented with an acute illness with associated systemic symptoms and significant discomfort requiring urgent management. In my opinion, this is a condition that a prudent lay person (someone who possesses an average knowledge of health and medicine) may potentially expect to result in complications if not addressed urgently such as respiratory distress, impairment of bodily function or dysfunction of bodily organs.   Routine symptom specific, illness specific and/or disease specific instructions were discussed with the patient and/or  caregiver at length.  As such, the patient has been evaluated and assessed, work-up was performed and treatment was provided in alignment with urgent care protocols and evidence based medicine.  Patient/parent/caregiver has been advised that the patient may require follow up for further testing and treatment if the symptoms continue in spite of treatment, as clinically indicated and appropriate.  If the patient was tested for COVID-19, Influenza and/or RSV, then the patient/parent/guardian was advised to isolate at home pending the results of his/her diagnostic coronavirus test and potentially longer if they're positive. I have also advised pt that if his/her COVID-19 test returns positive, it's recommended to self-isolate for at least 10 days after symptoms first appeared AND until fever-free for 24 hours without fever reducer AND other symptoms have improved or resolved. Discussed self-isolation recommendations as well as instructions for household member/close contacts as per the [facility] and [place] [facility], and also gave patient the COVID packet with this information.  Patient/parent/caregiver has been advised to return to the [facility] or PCP in 3-5 days if no better; to PCP or the Emergency Department if new signs and symptoms develop, or if the current signs or symptoms continue to change or worsen for further workup, evaluation and treatment as clinically indicated and appropriate  The patient will follow up with their current PCP if and as advised. If the patient does not currently have a PCP we will assist them in obtaining one.   The patient may need specialty follow up if the symptoms continue, in spite of conservative treatment and management, for further workup, evaluation, consultation and treatment as clinically indicated and appropriate.  Patient/parent/caregiver verbalized understanding and agreement of plan as discussed.  All questions were addressed during visit.  Please see discharge instructions  below for further details of plan.  Discharge Instructions:   Discharge Instructions      Because of your exposure to influenza, recommended to begin Tamiflu now.  While this medication may not make you feel significantly better and will keep you from feeling significantly worse.  Please take 1 capsule twice daily for the next 5 days.  Our influenza tests are on backorder right now so we are unable to test you for flu today.  Please read below to learn more about the medications, dosages and frequencies that I recommend to help alleviate your symptoms and to get you feeling better soon:   Medrol (methylprednisolone): This is a steroid that will significantly calm your upper and lower airways, please take 3 tablets daily with your breakfast meal starting tomorrow morning until the prescription is complete.      Zyrtec (cetirizine): This is an excellent second-generation antihistamine that helps to reduce respiratory inflammatory response to environmental allergens.  In some patients, this medication can cause daytime sleepiness so I recommend that you take 1 tablet daily at bedtime.     Singulair (montelukast): This is a mast cell stabilizer that works well with antihistamines.  Mast cells are responsible for stimulating histamine production so you can imagine that if we can reduce the activity of your mast cells, then fewer histamines will be produced and inflammation caused by allergy exposure will be significantly reduced.  I recommend that you take this medication at the same time you take your antihistamine.   ProAir, Ventolin, Proventil (albuterol): This inhaled medication contains a short acting beta agonist bronchodilator.  This medication works on the smooth muscle that opens and constricts of your airways by relaxing the muscle.  The result of relaxation of the smooth muscle is  increased air movement and improved work of breathing.  This is a short acting medication that can be used every  4-6 hours as needed for increased work of breathing, shortness of breath, wheezing and excessive coughing.  I have provided you with a prescription.   Robitussin, Mucinex (guaifenesin): This is an expectorant.  This helps break up chest congestion and loosen up thick nasal drainage making phlegm and drainage more liquid and therefore easier to remove.  I recommend being 400 mg three times daily as needed.      Promethazine DM: Promethazine is both a nasal decongestant and an antinausea medication that makes most patients feel fairly sleepy.  The DM is dextromethorphan, a cough suppressant found in many over-the-counter cough medications.  Please take 5 mL before bedtime to minimize your cough which will help you sleep better.  I have sent a prescription for this medication to your pharmacy.  If you find that you have not had improvement of your symptoms in the next 5 to 7 days, please follow-up with your primary care provider or return here to urgent care for repeat evaluation and further recommendations.   Thank you for visiting urgent care today.  We appreciate the opportunity to participate in your care.       This office note has been dictated using Teaching laboratory technician.  Unfortunately, this method of dictation can sometimes lead to typographical or grammatical errors.  I apologize for your inconvenience in advance if this occurs.  Please do not hesitate to reach out to me if clarification is needed.      Theadora Rama Scales, PA-C 06/16/22 1427

## 2022-06-14 NOTE — Discharge Instructions (Addendum)
Because of your exposure to influenza, recommended to begin Tamiflu now.  While this medication may not make you feel significantly better and will keep you from feeling significantly worse.  Please take 1 capsule twice daily for the next 5 days.  Our influenza tests are on backorder right now so we are unable to test you for flu today.  Please read below to learn more about the medications, dosages and frequencies that I recommend to help alleviate your symptoms and to get you feeling better soon:   Medrol (methylprednisolone): This is a steroid that will significantly calm your upper and lower airways, please take 3 tablets daily with your breakfast meal starting tomorrow morning until the prescription is complete.      Zyrtec (cetirizine): This is an excellent second-generation antihistamine that helps to reduce respiratory inflammatory response to environmental allergens.  In some patients, this medication can cause daytime sleepiness so I recommend that you take 1 tablet daily at bedtime.     Singulair (montelukast): This is a mast cell stabilizer that works well with antihistamines.  Mast cells are responsible for stimulating histamine production so you can imagine that if we can reduce the activity of your mast cells, then fewer histamines will be produced and inflammation caused by allergy exposure will be significantly reduced.  I recommend that you take this medication at the same time you take your antihistamine.   ProAir, Ventolin, Proventil (albuterol): This inhaled medication contains a short acting beta agonist bronchodilator.  This medication works on the smooth muscle that opens and constricts of your airways by relaxing the muscle.  The result of relaxation of the smooth muscle is increased air movement and improved work of breathing.  This is a short acting medication that can be used every 4-6 hours as needed for increased work of breathing, shortness of breath, wheezing and excessive  coughing.  I have provided you with a prescription.   Robitussin, Mucinex (guaifenesin): This is an expectorant.  This helps break up chest congestion and loosen up thick nasal drainage making phlegm and drainage more liquid and therefore easier to remove.  I recommend being 400 mg three times daily as needed.      Promethazine DM: Promethazine is both a nasal decongestant and an antinausea medication that makes most patients feel fairly sleepy.  The DM is dextromethorphan, a cough suppressant found in many over-the-counter cough medications.  Please take 5 mL before bedtime to minimize your cough which will help you sleep better.  I have sent a prescription for this medication to your pharmacy.  If you find that you have not had improvement of your symptoms in the next 5 to 7 days, please follow-up with your primary care provider or return here to urgent care for repeat evaluation and further recommendations.   Thank you for visiting urgent care today.  We appreciate the opportunity to participate in your care.

## 2022-06-17 ENCOUNTER — Other Ambulatory Visit: Payer: Self-pay

## 2022-06-17 ENCOUNTER — Encounter (HOSPITAL_COMMUNITY): Payer: Self-pay

## 2022-06-17 ENCOUNTER — Emergency Department (HOSPITAL_COMMUNITY)
Admission: EM | Admit: 2022-06-17 | Discharge: 2022-06-17 | Disposition: A | Discharge status: Home / Self Care | Payer: Medicaid Other | Attending: Emergency Medicine | Admitting: Emergency Medicine

## 2022-06-17 DIAGNOSIS — Z1152 Encounter for screening for COVID-19: Secondary | ICD-10-CM | POA: Diagnosis not present

## 2022-06-17 DIAGNOSIS — Z7951 Long term (current) use of inhaled steroids: Secondary | ICD-10-CM | POA: Diagnosis not present

## 2022-06-17 DIAGNOSIS — J45909 Unspecified asthma, uncomplicated: Secondary | ICD-10-CM | POA: Diagnosis not present

## 2022-06-17 DIAGNOSIS — J101 Influenza due to other identified influenza virus with other respiratory manifestations: Secondary | ICD-10-CM | POA: Diagnosis not present

## 2022-06-17 DIAGNOSIS — R0981 Nasal congestion: Secondary | ICD-10-CM | POA: Diagnosis present

## 2022-06-17 LAB — RESP PANEL BY RT-PCR (RSV, FLU A&B, COVID)  RVPGX2
Influenza A by PCR: NEGATIVE
Influenza B by PCR: POSITIVE — AB
Resp Syncytial Virus by PCR: NEGATIVE
SARS Coronavirus 2 by RT PCR: NEGATIVE

## 2022-06-17 NOTE — ED Provider Triage Note (Signed)
Emergency Medicine Provider Triage Evaluation Note  QUINTYN DOMBEK , a 21 y.o. male  was evaluated in triage.  Pt complains of bodyaches, nausea, subjective fever for the past 4 days.  Patient was seen at urgent care on the 26th and diagnosed with a mild intermittent asthma with acute exacerbation.  He was reportedly exposed to influenza 2 days prior to that visit.  Patient was prescribed Tamiflu but states he still feels sick.  No testing was performed at that time.  Patient is here today requesting COVID and influenza testing  Review of Systems  Positive: As above Negative: As above  Physical Exam  BP (!) 144/99 (BP Location: Right Arm)   Pulse (!) 103   Temp 98.3 F (36.8 C) (Oral)   Resp 20   Ht 6' (1.829 m)   Wt 97.5 kg   SpO2 100%   BMI 29.16 kg/m  Gen:   Awake, no distress   Resp:  Normal effort  MSK:   Moves extremities without difficulty  Other:    Medical Decision Making  Medically screening exam initiated at 9:50 PM.  Appropriate orders placed.  DURWOOD DITTUS was informed that the remainder of the evaluation will be completed by another provider, this initial triage assessment does not replace that evaluation, and the importance of remaining in the ED until their evaluation is complete.     Pamala Duffel 06/17/22 2153

## 2022-06-17 NOTE — ED Triage Notes (Signed)
Nasal congestion and feels stuffy for last several days. Went to UC but was told they were out of respiratory swabs.

## 2022-06-17 NOTE — ED Provider Notes (Signed)
[facility] DEPT Provider Note   CSN: [id number] Arrival date & time: 06/17/22  2134     History  Chief Complaint  Patient presents with   Nasal Congestion    Tom [name] is a 21 y.o. male.  Patient presents emergency department complaining of nasal congestion, body aches, nausea, subjective fever for the past 4 days.  He was evaluated in urgent care on the 26th and was prescribed Tamiflu for prophylaxis due to recent flu exposure.  No respiratory testing was performed at that time.  Patient is here today requesting COVID and influenza testing.  Past medical history significant for exercise-induced asthma, GERD, chronic asthma  HPI     Home Medications Prior to Admission medications   Medication Sig Start Date End Date Taking? Authorizing Provider  albuterol (VENTOLIN HFA) 108 (90 Base) MCG/ACT inhaler Inhale 2 puffs into the lungs every 6 (six) hours as needed for wheezing or shortness of breath (Cough). 06/14/22   Tom Oxford Scales, PA-C  cetirizine (ZYRTEC ALLERGY) 10 MG tablet Take 1 tablet (10 mg total) by mouth at bedtime. 06/14/22 12/11/22  Tom Oxford Scales, PA-C  guaifenesin (HUMIBID E) 400 MG TABS tablet Take 1 tablet 3 times daily as needed for chest congestion and cough 06/14/22   Tom Oxford Scales, PA-C  methylPREDNISolone (MEDROL) 8 MG tablet Take 4 tablets (32 mg total) by mouth daily for 3 days. 06/14/22 06/17/22  Tom Oxford Scales, PA-C  montelukast (SINGULAIR) 10 MG tablet Take 1 tablet (10 mg total) by mouth at bedtime. 06/14/22 12/11/22  Tom Oxford Scales, PA-C  ondansetron (ZOFRAN-ODT) 4 MG disintegrating tablet Take 1 tablet (4 mg total) by mouth every 8 (eight) hours as needed for nausea or vomiting. 06/14/22   Tom Oxford Scales, PA-C  oseltamivir (TAMIFLU) 75 MG capsule Take 1 capsule (75 mg total) by mouth every 12 (twelve) hours for 5 days. 06/14/22 06/19/22  Tom Oxford Scales, PA-C   promethazine-dextromethorphan (PROMETHAZINE-DM) 6.25-15 MG/5ML syrup Take 5 mLs by mouth 4 (four) times daily as needed for cough. 06/14/22   Tom Oxford Scales, PA-C      Allergies    Lactose intolerance (gi)    Review of Systems   Review of Systems  Constitutional:  Positive for chills and fever.  HENT:  Positive for rhinorrhea.   Respiratory:  Negative for shortness of breath.   Gastrointestinal:  Negative for abdominal pain.  Musculoskeletal:  Positive for myalgias.    Physical Exam Updated Vital Signs BP (!) 144/99 (BP Location: Right Arm)   Pulse (!) 103   Temp 98.3 F (36.8 C) (Oral)   Resp 20   Ht 6' (1.829 m)   Wt 97.5 kg   SpO2 100%   BMI 29.16 kg/m  Physical Exam Vitals and nursing note reviewed.  HENT:     Head: Normocephalic and atraumatic.     Nose: Congestion present.     Mouth/Throat:     Mouth: Mucous membranes are moist.  Eyes:     Conjunctiva/sclera: Conjunctivae normal.  Cardiovascular:     Rate and Rhythm: Normal rate and regular rhythm.  Pulmonary:     Effort: Pulmonary effort is normal. No respiratory distress.     Breath sounds: Normal breath sounds.  Musculoskeletal:        General: No signs of injury.     Cervical back: Normal range of motion.  Skin:    General: Skin is dry.  Neurological:     Mental Status: He is  alert.  Psychiatric:        Speech: Speech normal.        Behavior: Behavior normal.     ED Results / Procedures / Treatments   Labs (all labs ordered are listed, but only abnormal results are displayed) Labs Reviewed  RESP PANEL BY RT-PCR (RSV, FLU A&B, COVID)  RVPGX2 - Abnormal; Notable for the following components:      Result Value   Influenza B by PCR POSITIVE (*)    All other components within normal limits    EKG None  Radiology No results found.  Procedures Procedures    Medications Ordered in ED Medications - No data to display  ED Course/ Medical Decision Making/ A&P                            Medical Decision Making  Patient presents with chief complaints of bodyaches, chills, fever at home.  Differential diagnosis includes but is not limited to COVID-19, influenza, RSV, and other viral illnesses  I reviewed the patient's medical history including charts from December 26 where he was evaluated in urgent care.  He was prescribed Tamiflu at that time.  No respiratory testing was performed  I ordered and reviewed lab results.  Patient tested positive for influenza B  The patient's lungs are clear to auscultation and there is no indication at this time for imaging  I discussed supportive care with the patient.  The patient will discharge home and use ibuprofen or acetaminophen as needed for fever and pain control.  The patient voices understanding with this plan.  He may finish the Tamiflu as prescribed.  Work note provided.        Final Clinical Impression(s) / ED Diagnoses Final diagnoses:  Influenza B    Rx / DC Orders ED Discharge Orders     None         Pamala Duffel 06/17/22 2335    Mardene Sayer, MD 06/18/22 [phone]

## 2022-06-17 NOTE — Discharge Instructions (Addendum)
You were diagnosed today with influenza B.  Please take medication including ibuprofen or Tylenol as needed for fever and pain control.  If you develop any life-threatening symptoms return to the emergency department for further evaluation

## 2022-06-20 NOTE — ED Notes (Signed)
Assessed pts chart to print pt work note.  He came in asking for it today.

## 2022-07-16 IMAGING — CR DG LUMBAR SPINE COMPLETE 4+V
5 series · 5 of 5 positions shown · non-contrast
Comparison: None.

CLINICAL DATA: Football practice. Acute onset of pain and popping
sensation. Question spondylo lysis.

EXAM:
LUMBAR SPINE - COMPLETE 4+ VIEW

[l-spine ap]
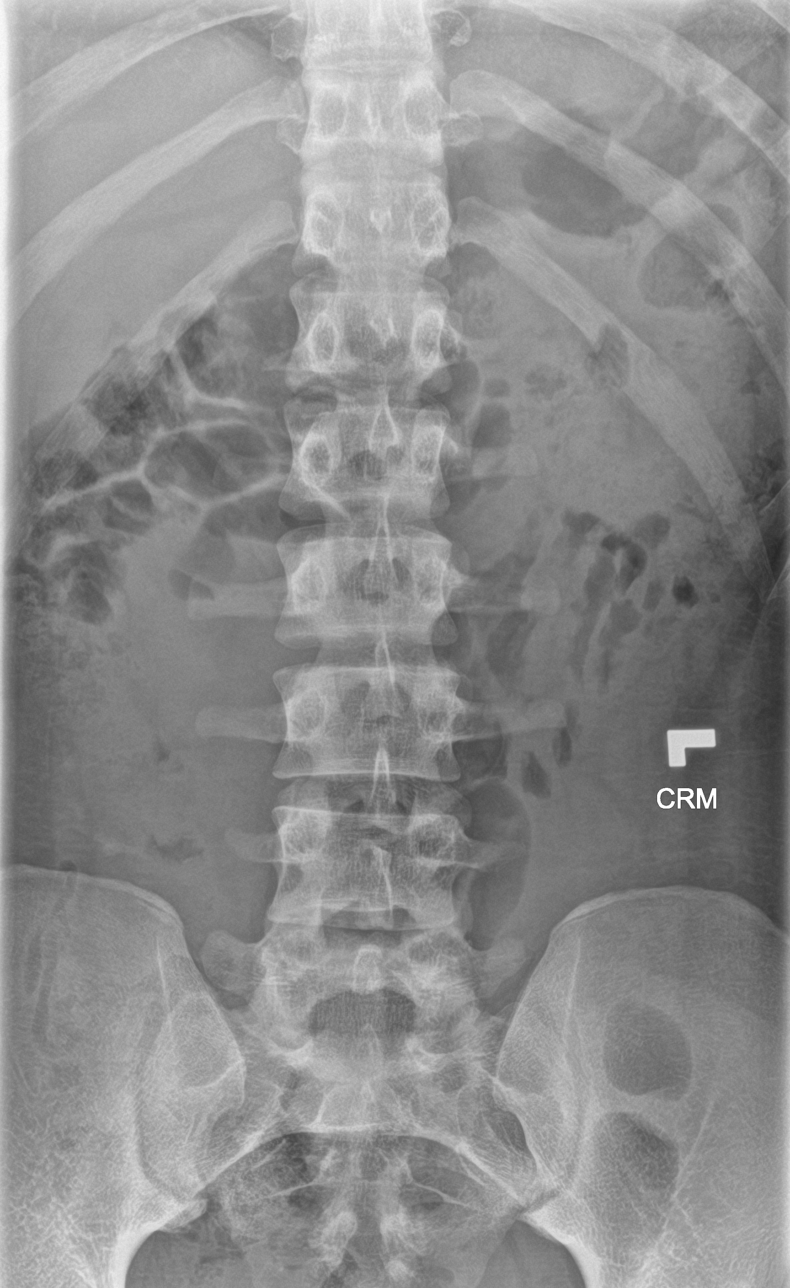

[l-spine obl (1 of 2)]
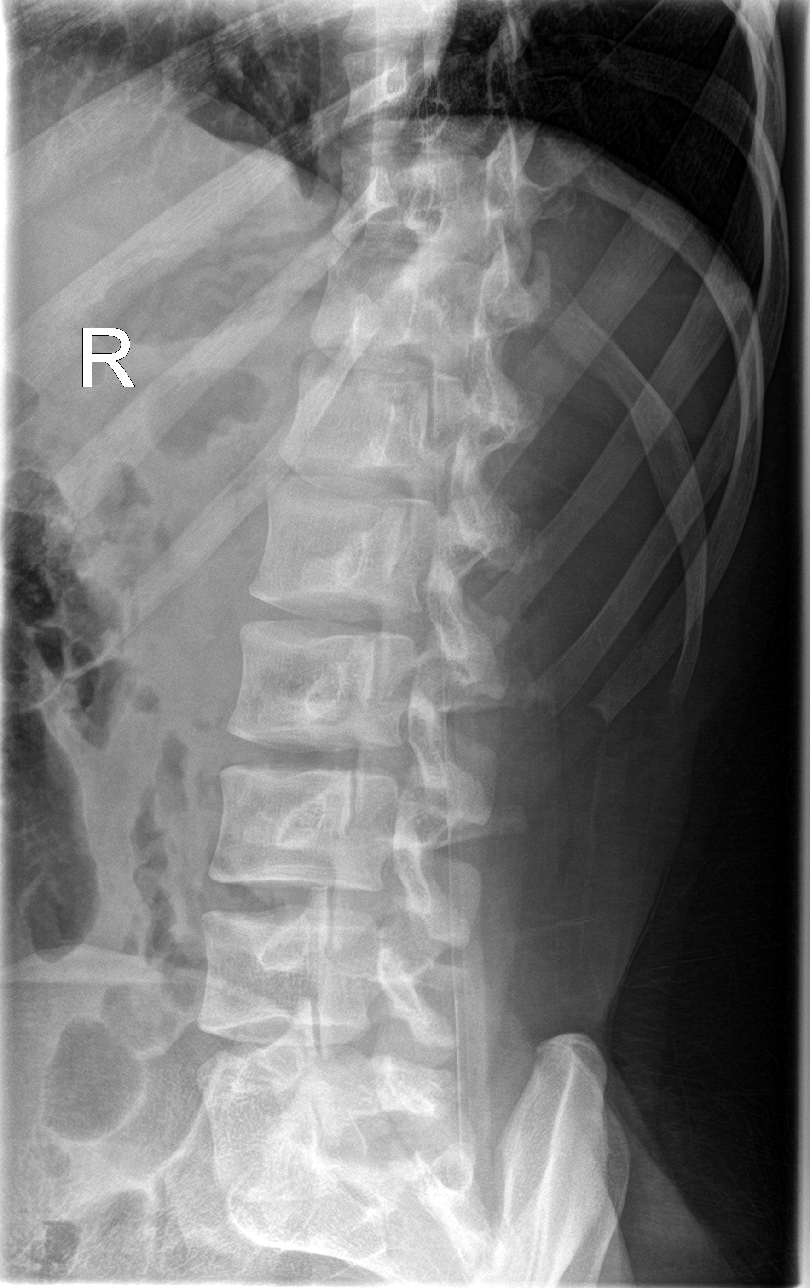

[l-spine obl (2 of 2)]
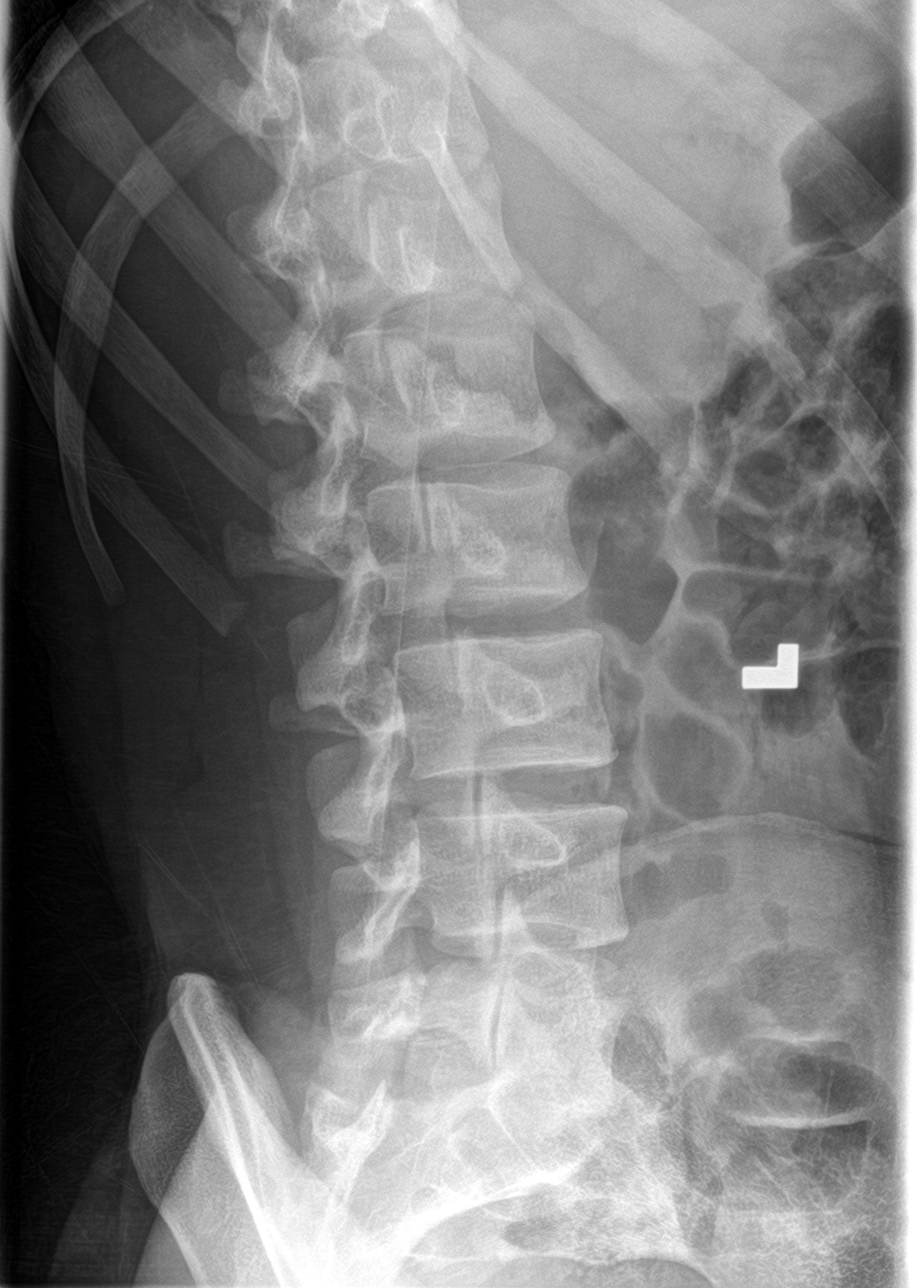

[l-spine lat]
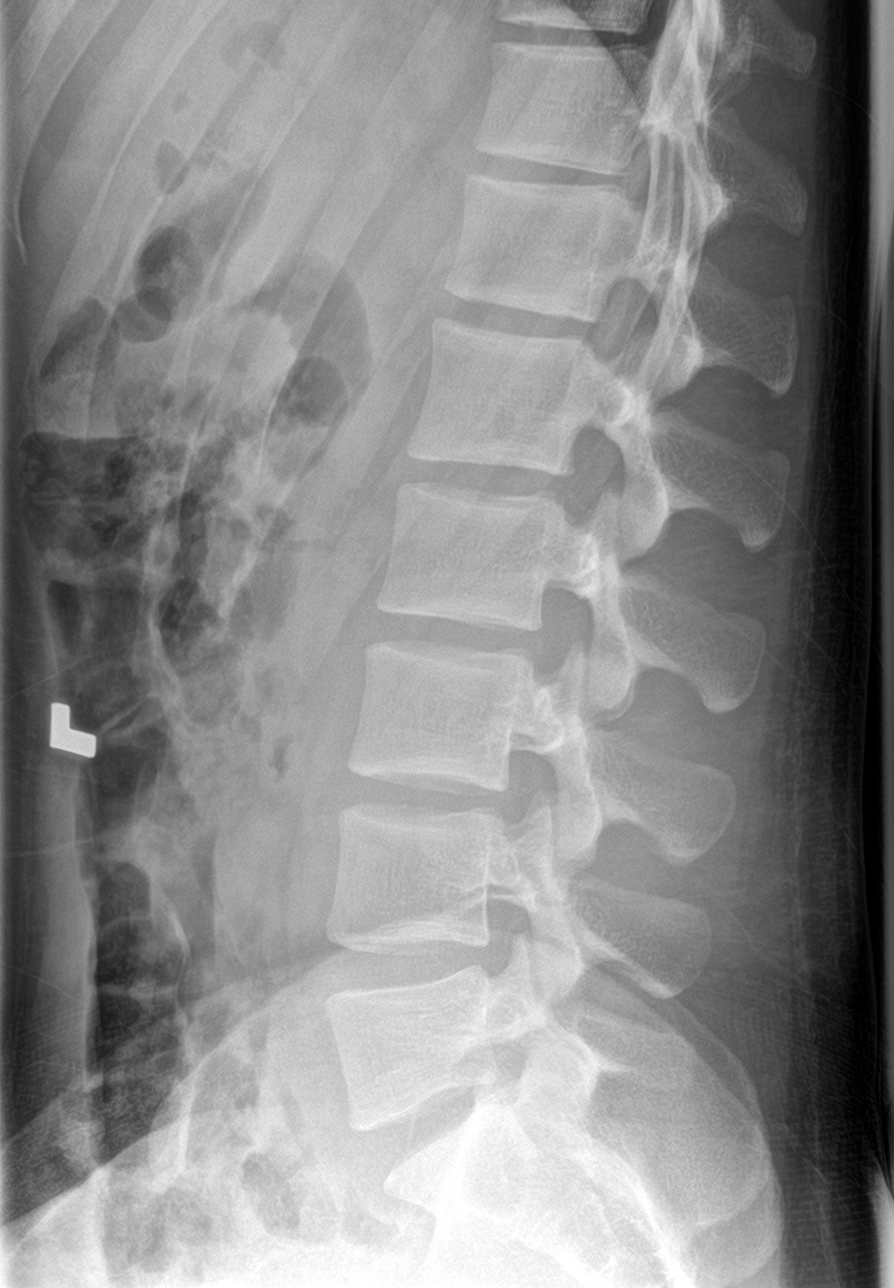

[l-spine spot]
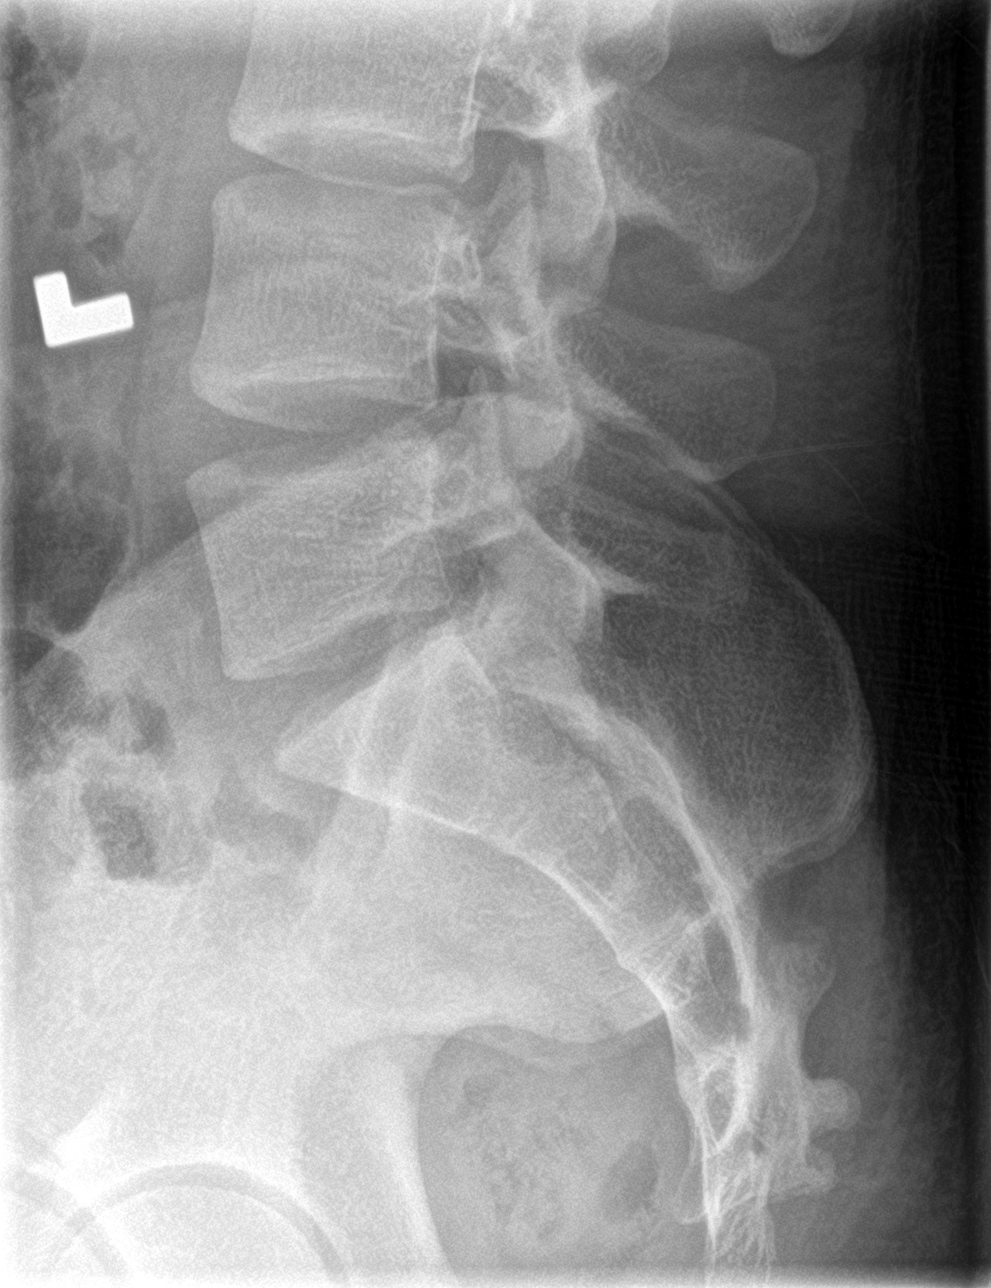

[5 of 5 positions shown; findings below may reference images not displayed]

FINDINGS: Six non rib bearing type vertebral bodies. Alignment is normal. No
disc space narrowing. No pars defect or pars fracture.
IMPRESSION: Normal radiographs.

## 2022-09-25 ENCOUNTER — Encounter (HOSPITAL_COMMUNITY): Payer: Self-pay

## 2022-09-25 ENCOUNTER — Other Ambulatory Visit: Payer: Self-pay

## 2022-09-25 ENCOUNTER — Emergency Department (HOSPITAL_COMMUNITY): Payer: Medicaid Other

## 2022-09-25 ENCOUNTER — Emergency Department (HOSPITAL_COMMUNITY)
Admission: EM | Admit: 2022-09-25 | Discharge: 2022-09-25 | Disposition: A | Discharge status: Home / Self Care | Payer: Medicaid Other | Attending: Emergency Medicine | Admitting: Emergency Medicine

## 2022-09-25 DIAGNOSIS — Z79899 Other long term (current) drug therapy: Secondary | ICD-10-CM | POA: Insufficient documentation

## 2022-09-25 DIAGNOSIS — F419 Anxiety disorder, unspecified: Secondary | ICD-10-CM | POA: Diagnosis not present

## 2022-09-25 DIAGNOSIS — R0789 Other chest pain: Secondary | ICD-10-CM | POA: Diagnosis present

## 2022-09-25 LAB — RAPID URINE DRUG SCREEN, HOSP PERFORMED
Amphetamines: NOT DETECTED
Barbiturates: NOT DETECTED
Benzodiazepines: NOT DETECTED
Cocaine: NOT DETECTED
Opiates: NOT DETECTED
Tetrahydrocannabinol: POSITIVE — AB

## 2022-09-25 LAB — TROPONIN I (HIGH SENSITIVITY): Troponin I (High Sensitivity): 2 ng/L (ref <18)

## 2022-09-25 MED ORDER — HYDROXYZINE HCL 25 MG PO TABS
25.0000 mg | ORAL_TABLET | Freq: Once | ORAL | Status: DC
  Filled 2022-09-25: qty 1

## 2022-09-25 MED ORDER — ACETAMINOPHEN 500 MG PO TABS
1000.0000 mg | ORAL_TABLET | Freq: Once | ORAL | Status: AC
  Administered 2022-09-25: 1000 mg via ORAL
  Filled 2022-09-25: qty 2

## 2022-09-25 NOTE — ED Provider Notes (Signed)
[place] EM[facility]GENCY DEPARTMENT AT [facility]   CSN: [id number] Arrival date & time: 09/25/22  2006     History  Chief Complaint  Patient presents with   Chest Pain   Anxiety    Tom [name] is a 22 y.o. male.  22 year old male with a history of bipolar disorder presents to the emergency department for evaluation of chest tightness.  Chest tightness has been in the center of his chest and has been intermittent over the past 2 weeks.  It is exacerbated by stressful situations and when he feels anxious.  He denies any medications for symptoms prior to arrival.  He has had a panic attack in the past and notes that this feels similar.  He has not had any fevers, hemoptysis, cough, nasal congestion, nausea, vomiting, leg swelling.  No family history of sudden death or heart related reasons.  He does endorse consistent caffeine use, but denies other stimulant use such as MDMA, cocaine, amphetamines.  Does smoke marijuana regularly.  The history is provided by the patient. No language interpreter was used.  Chest Pain Associated symptoms: anxiety   Anxiety Associated symptoms include chest pain.       Home Medications Prior to Admission medications   Medication Sig Start Date End Date Taking? Authorizing Provider  albuterol (VENTOLIN HFA) 108 (90 Base) MCG/ACT inhaler Inhale 2 puffs into the lungs every 6 (six) hours as needed for wheezing or shortness of breath (Cough). 06/14/22   Theadora RamaMorgan, Lindsay Scales, PA-C  cetirizine (ZYRTEC ALLERGY) 10 MG tablet Take 1 tablet (10 mg total) by mouth at bedtime. 06/14/22 12/11/22  Theadora RamaMorgan, Lindsay Scales, PA-C  guaifenesin (HUMIBID E) 400 MG TABS tablet Take 1 tablet 3 times daily as needed for chest congestion and cough 06/14/22   Theadora RamaMorgan, Lindsay Scales, PA-C  montelukast (SINGULAIR) 10 MG tablet Take 1 tablet (10 mg total) by mouth at bedtime. 06/14/22 12/11/22  Theadora RamaMorgan, Lindsay Scales, PA-C  ondansetron (ZOFRAN-ODT) 4 MG  disintegrating tablet Take 1 tablet (4 mg total) by mouth every 8 (eight) hours as needed for nausea or vomiting. 06/14/22   Theadora RamaMorgan, Lindsay Scales, PA-C  promethazine-dextromethorphan (PROMETHAZINE-DM) 6.25-15 MG/5ML syrup Take 5 mLs by mouth 4 (four) times daily as needed for cough. 06/14/22   Theadora RamaMorgan, Lindsay Scales, PA-C      Allergies    Lactose intolerance (gi)    Review of Systems   Review of Systems  Cardiovascular:  Positive for chest pain.  Ten systems reviewed and are negative for acute change, except as noted in the HPI.    Physical Exam Updated Vital Signs BP (!) 142/71   Pulse 94   Temp 98.4 F (36.9 C) (Oral)   Resp 19   Ht 6' (1.829 m)   Wt 97.5 kg   SpO2 99%   BMI 29.16 kg/m   Physical Exam Vitals and nursing note reviewed.  Constitutional:      General: He is not in acute distress.    Appearance: He is well-developed. He is not diaphoretic.     Comments: Nontoxic appearing and in NAD  HENT:     Head: Normocephalic and atraumatic.  Eyes:     General: No scleral icterus.    Conjunctiva/sclera: Conjunctivae normal.  Cardiovascular:     Rate and Rhythm: Regular rhythm.     Pulses: Normal pulses.     Comments: HR 99-105bpm Pulmonary:     Effort: Pulmonary effort is normal. No respiratory distress.  Breath sounds: No stridor. No wheezing.     Comments: Respirations even and unlabored. Lungs CTAB. Musculoskeletal:        General: Normal range of motion.     Cervical back: Normal range of motion.     Comments: No BLE edema.  Skin:    General: Skin is warm and dry.     Coloration: Skin is not pale.     Findings: No erythema or rash.  Neurological:     Mental Status: He is alert and oriented to person, place, and time.     Coordination: Coordination normal.  Psychiatric:        Behavior: Behavior normal.     ED Results / Procedures / Treatments   Labs (all labs ordered are listed, but only abnormal results are displayed) Labs Reviewed  RAPID  URINE DRUG SCREEN, HOSP PERFORMED - Abnormal; Notable for the following components:      Result Value   Tetrahydrocannabinol POSITIVE (*)    All other components within normal limits  TROPONIN I (HIGH SENSITIVITY)    EKG EKG Interpretation  Date/Time:  Sunday September 25 2022 20:38:58 EDT Ventricular Rate:  94 PR Interval:  163 QRS Duration: 100 QT Interval:  325 QTC Calculation: 407 R Axis:   75 Text Interpretation: Sinus rhythm Borderline ST elevation, anterolateral leads Confirmed by Linwood Dibbles [phone]) on 09/25/2022 8:42:05 PM  Radiology DG Chest 2 View  Result Date: 09/25/2022 CLINICAL DATA:  Intermittent chest tightness x2 weeks. EXAM: CHEST - 2 VIEW COMPARISON:  February 16, 2021 FINDINGS: The heart size and mediastinal contours are within normal limits. Both lungs are clear. The visualized skeletal structures are unremarkable. IMPRESSION: No active cardiopulmonary disease. Electronically Signed   By: Aram Candela M.D.   On: 09/25/2022 21:23    Procedures Procedures    Medications Ordered in ED Medications  hydrOXYzine (ATARAX) tablet 25 mg (25 mg Oral Not Given 09/25/22 2102)  acetaminophen (TYLENOL) tablet 1,000 mg (1,000 mg Oral Given 09/25/22 2107)    ED Course/ Medical Decision Making/ A&P Clinical Course as of 09/25/22 2300  Sun Sep 25, 2022  2300 Patient reassessed.  He states that he is feeling better.  He has no other acute complaints. [KH]    Clinical Course User Index [KH] Antony Madura, PA-C                             Medical Decision Making Amount and/or Complexity of Data Reviewed Labs: ordered. Radiology: ordered.  Risk OTC drugs. Prescription drug management.   This patient presents to the ED for concern of chest tightness, this involves an extensive number of treatment options, and is a complaint that carries with it a high risk of complications and morbidity.  The differential diagnosis includes anxiety vs MSK vs acute bronchospasm vs viral  illness vs myocarditis.   Co morbidities that complicate the patient evaluation  Bipolar disorder   Additional history obtained:  Additional history obtained from girlfriend, at bedside External records from outside source obtained and reviewed including prior behavioral health outpatient visits in 2022 and 2023   Lab Tests:  I Ordered, and personally interpreted labs.  The pertinent results include:  normal troponin. UDS positive for marijuana.   Imaging Studies ordered:  I ordered imaging studies including CXR  I independently visualized and interpreted imaging which showed no acute cardiopulmonary abnormality I agree with the radiologist interpretation   Cardiac Monitoring:  The patient was  maintained on a cardiac monitor.  I personally viewed and interpreted the cardiac monitored which showed an underlying rhythm of: sinus tachycardia > NSR   Medicines ordered and prescription drug management:  I ordered medication including Tylenol for pain; patient declined atarax  Reevaluation of the patient after these medicines showed that the patient improved I have reviewed the patients home medicines and have made adjustments as needed   Test Considered:  Chem-8   Problem List / ED Course:  Low suspicion for emergent cardiac etiology given reassuring workup today.  EKG favored to reflect early repolarization and troponin negative. Doubt myocarditis. Chest x-ray without evidence of mediastinal widening to suggest dissection.  No pneumothorax, pneumonia, pleural effusion.   Suspect psychiatric component, anxiety. Patient with hx of bipolar disorder. Symptoms recur in situations of increased stress, per patient.   Reevaluation:  After the interventions noted above, I reevaluated the patient and found that they have :improved   Social Determinants of Health:  Good social support   Dispostion:  After consideration of the diagnostic results and the patients response  to treatment, I feel that the patent would benefit from outpatient PCP follow up.  Patient encouraged to avoid caffeinated beverages, discontinue marijuana use.  Return precautions discussed and provided. Patient discharged in stable condition with no unaddressed concerns.          Final Clinical Impression(s) / ED Diagnoses Final diagnoses:  Chest tightness    Rx / DC Orders ED Discharge Orders     None         Antony Madura, Cordelia Poche 09/25/22 2306    Linwood Dibbles, MD 09/27/22 [phone]

## 2022-09-25 NOTE — ED Triage Notes (Signed)
Patient reports a tightness in the center of his chest for the past two weeks intermittently, states it gets worse when he gets anxious or stressed. Also states he started shaking really bad not long ago and was concerned for seizure or panic attack.

## 2022-09-25 NOTE — Discharge Instructions (Signed)
Your workup in the ED today was reassuring.  We recommend follow-up with a primary care doctor should your symptoms persist.  Try to limit your caffeine intake as this could worsen or aggravate symptoms.  Avoid use of marijuana.  Return for new or concerning symptoms.

## 2022-11-14 IMAGING — DX DG HAND COMPLETE 3+V*R*
3 series · 3 of 3 positions shown · non-contrast
Comparison: None.

CLINICAL DATA: Tripped and fell and injured right thumb.

EXAM:
RIGHT HAND - COMPLETE 3+ VIEW

[hand pa]
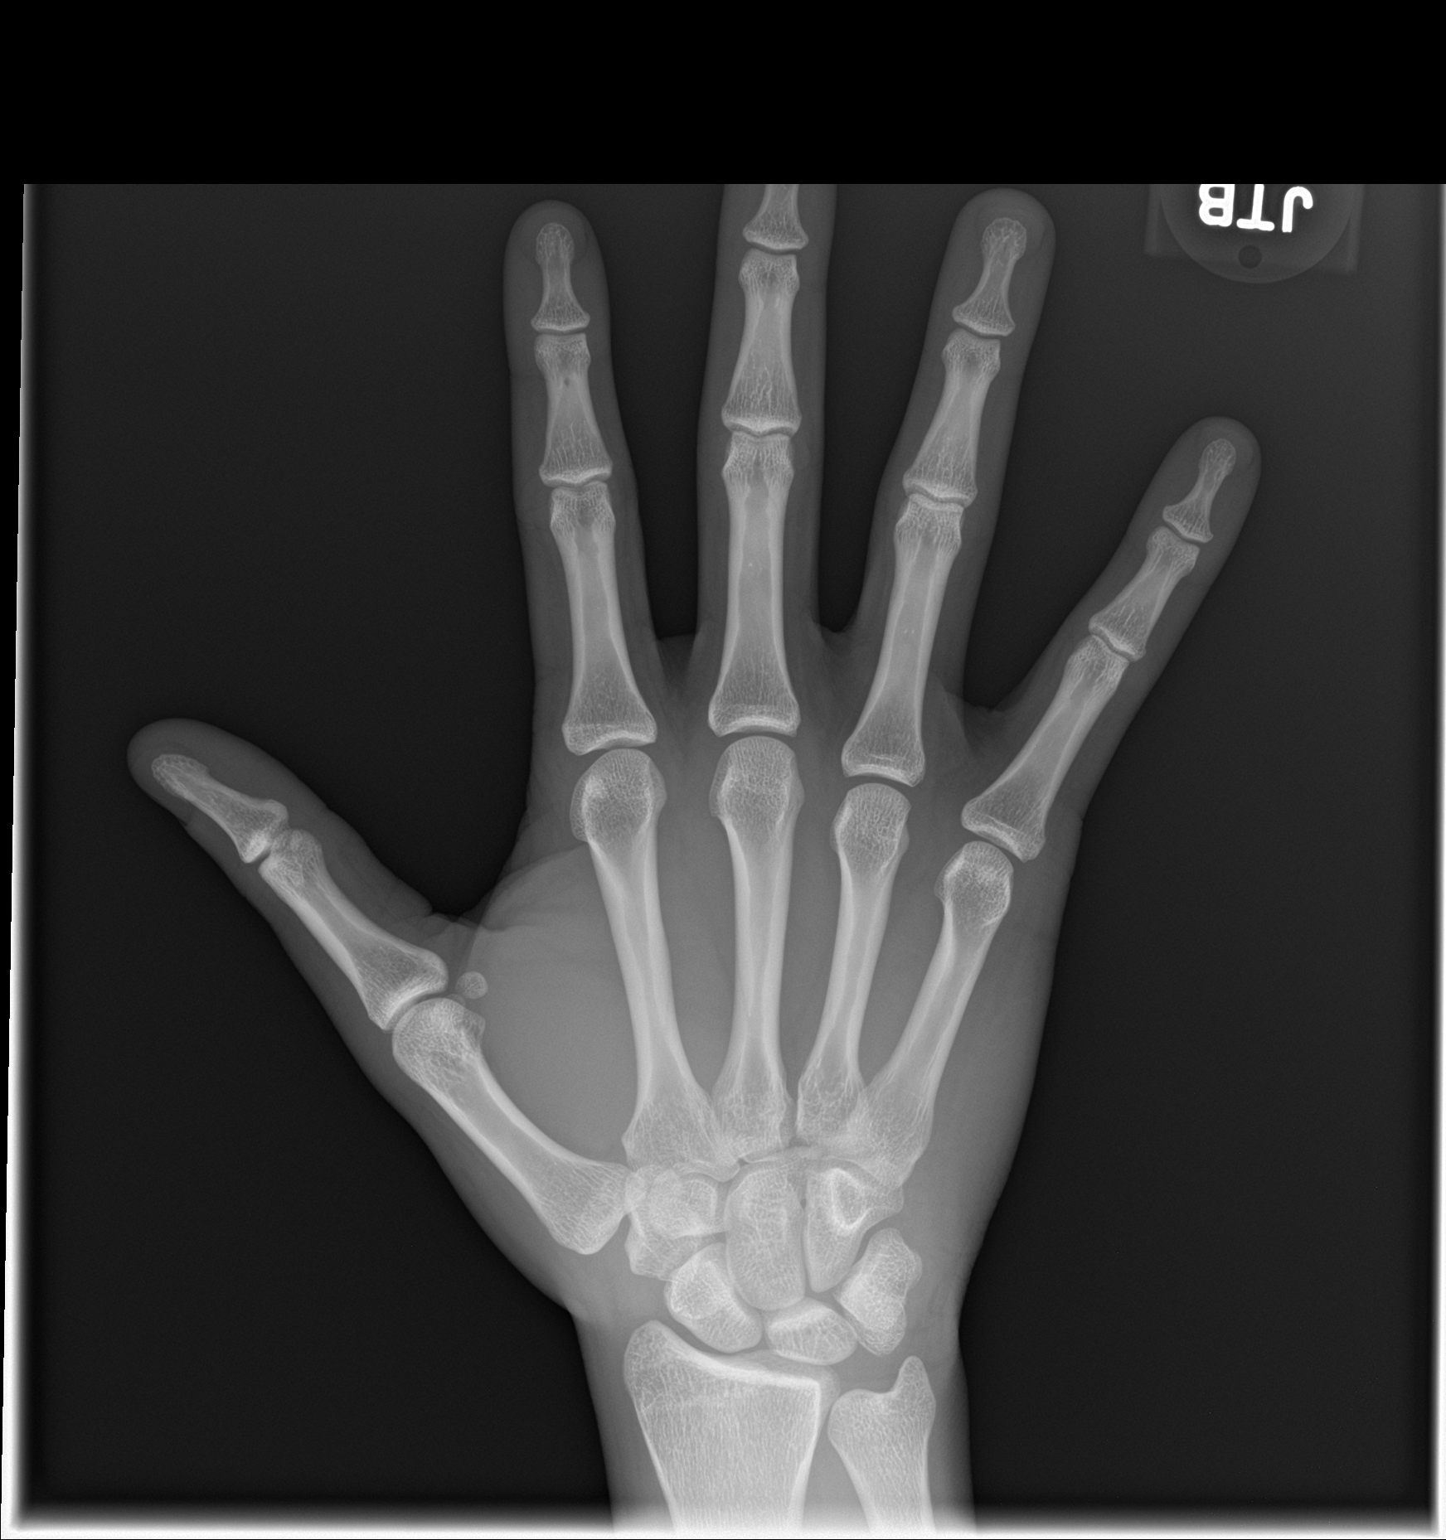

[hand obl]
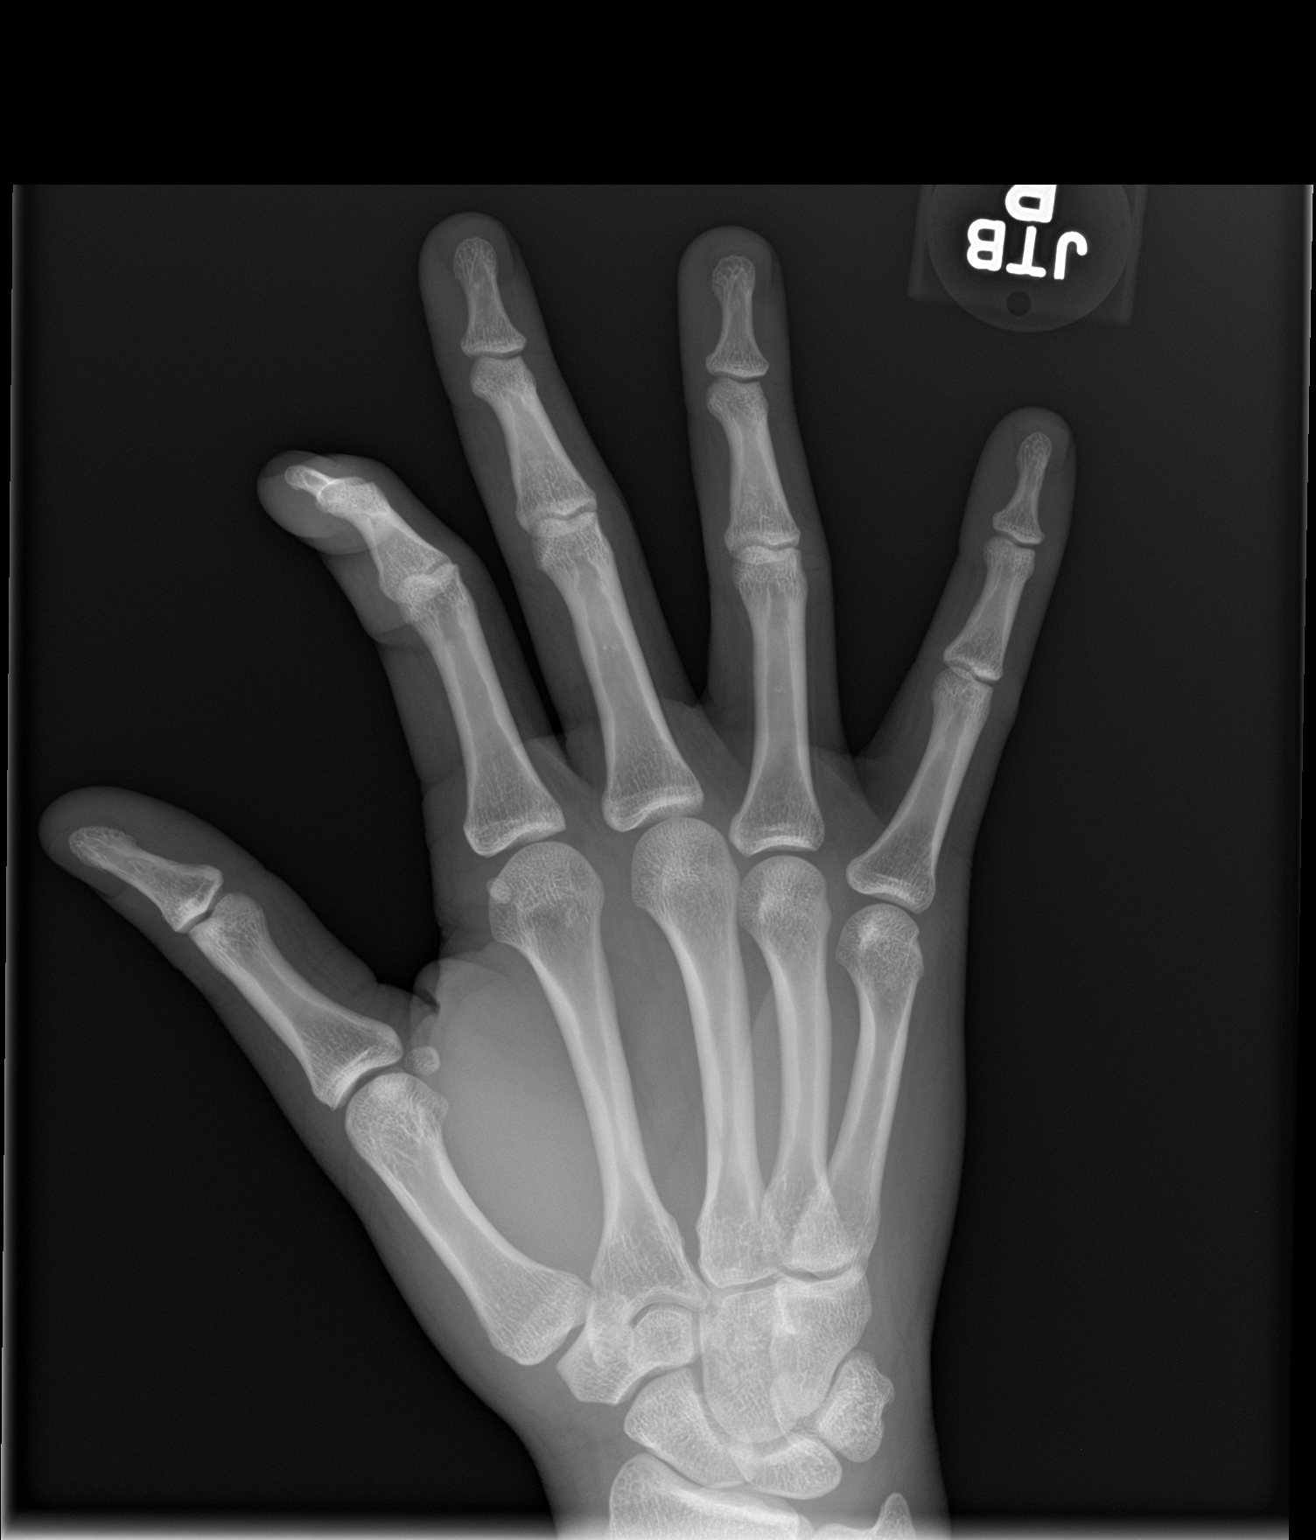

[hand lat]
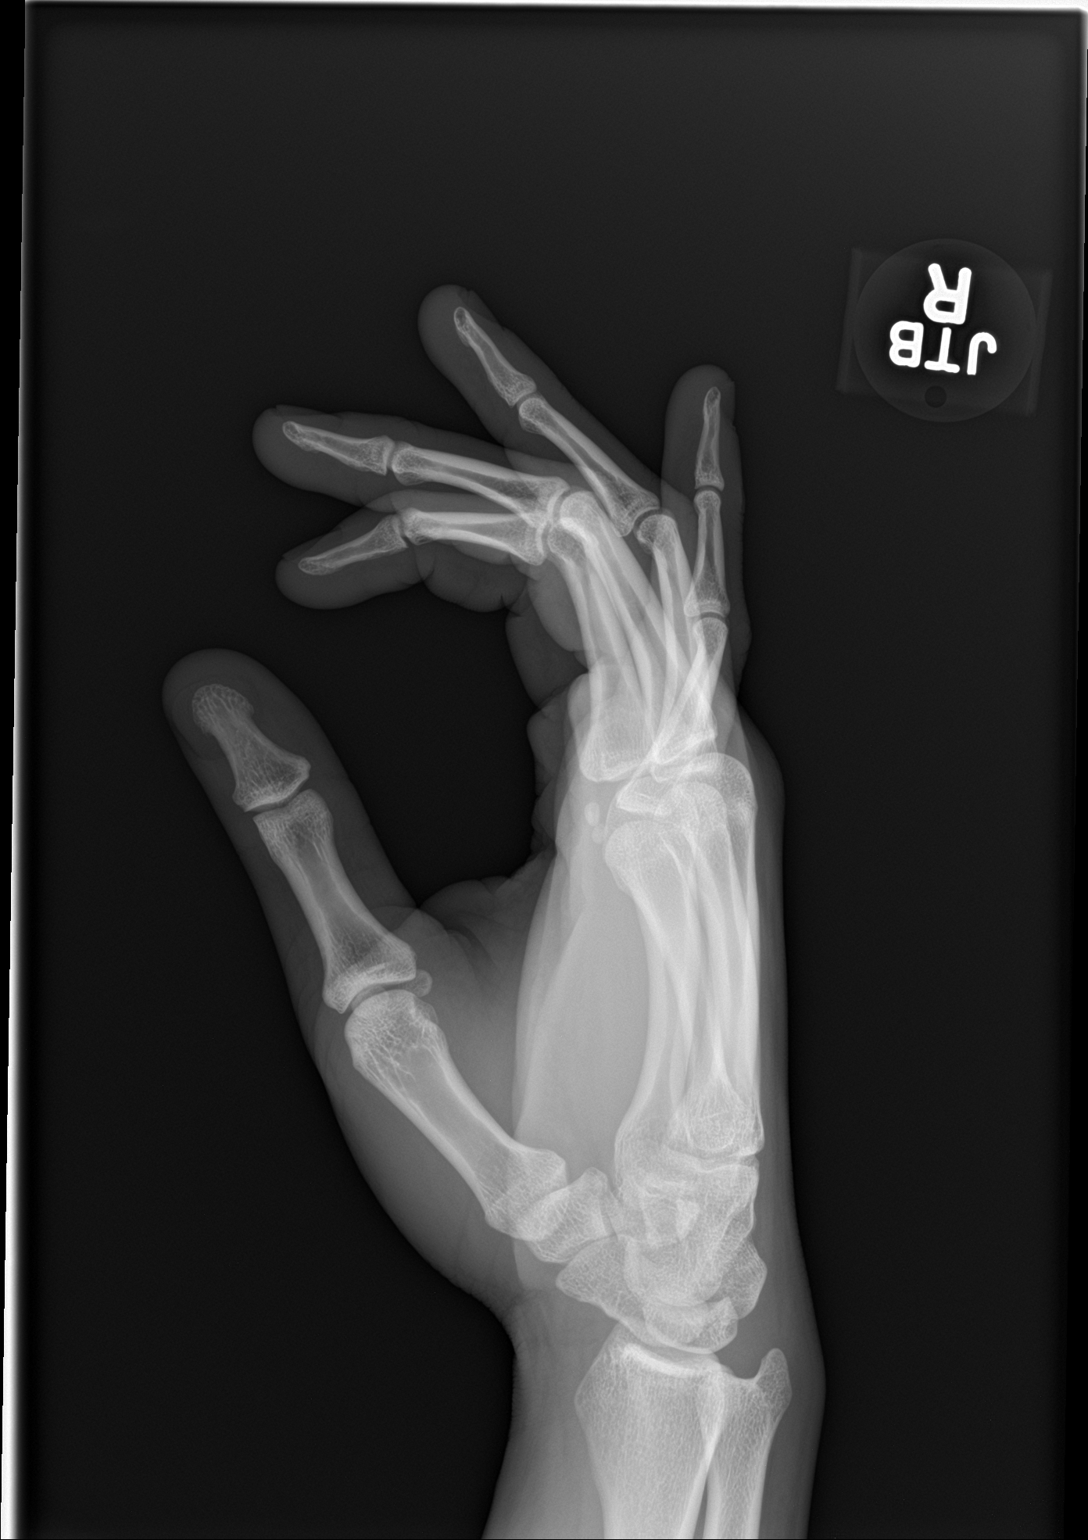

[3 of 3 positions shown; findings below may reference images not displayed]

FINDINGS: The joint spaces are normal.  No acute fractures are identified.
IMPRESSION: No acute bony findings.

## 2022-12-09 ENCOUNTER — Emergency Department (HOSPITAL_COMMUNITY)
Admission: EM | Admit: 2022-12-09 | Discharge: 2022-12-09 | Disposition: A | Discharge status: Home / Self Care | Payer: Medicaid Other | Attending: Emergency Medicine | Admitting: Emergency Medicine

## 2022-12-09 ENCOUNTER — Other Ambulatory Visit: Payer: Self-pay

## 2022-12-09 ENCOUNTER — Emergency Department (HOSPITAL_COMMUNITY): Payer: Medicaid Other

## 2022-12-09 DIAGNOSIS — Y990 Civilian activity done for income or pay: Secondary | ICD-10-CM | POA: Insufficient documentation

## 2022-12-09 DIAGNOSIS — M25521 Pain in right elbow: Secondary | ICD-10-CM | POA: Diagnosis present

## 2022-12-09 DIAGNOSIS — X509XXA Other and unspecified overexertion or strenuous movements or postures, initial encounter: Secondary | ICD-10-CM | POA: Diagnosis not present

## 2022-12-09 DIAGNOSIS — S5001XA Contusion of right elbow, initial encounter: Secondary | ICD-10-CM | POA: Diagnosis not present

## 2022-12-09 NOTE — Progress Notes (Signed)
Orthopedic Tech Progress Note Patient Details:  Tom [name] 09/02/2000 [id number]  Applied ACE wrap to RUE. Ortho Devices Type of Ortho Device: Ace wrap Ortho Device/Splint Location: RUE Ortho Device/Splint Interventions: Ordered, Application, Adjustment   Post Interventions Patient Tolerated: Well Instructions Provided: Care of device, Adjustment of device  Sherilyn Banker 12/09/2022, 1:32 PM

## 2022-12-09 NOTE — Discharge Instructions (Addendum)
Return for any problem.  ?

## 2022-12-09 NOTE — ED Triage Notes (Signed)
Pt c/o R elbow pain that began when they non-traumatically twisted their arm while working. Pt has most ROM

## 2022-12-09 NOTE — ED Provider Notes (Signed)
[place] EMERGENCY DEPARTMENT AT [facility], [place] Provider Note   CSN: [id number] Arrival date & time: 12/09/22  1052     History  Chief Complaint  Patient presents with   Elbow Pain    Tom [name] is a 22 y.o. male.  22 year old male with prior medical history as detailed below presents for evaluation. Patient reports right elbow pain.  Patient reports that yesterday while at work a large box "bumped" against his right elbow.  He denies crush injury.  He reports that his elbow was bothering him today so he called out of work.  He is requesting a note for work.  The history is provided by the patient and medical records.       Home Medications Prior to Admission medications   Medication Sig Start Date End Date Taking? Authorizing Provider  albuterol (VENTOLIN HFA) 108 (90 Base) MCG/ACT inhaler Inhale 2 puffs into the lungs every 6 (six) hours as needed for wheezing or shortness of breath (Cough). 06/14/22   Theadora Rama Scales, PA-C  cetirizine (ZYRTEC ALLERGY) 10 MG tablet Take 1 tablet (10 mg total) by mouth at bedtime. 06/14/22 12/11/22  Theadora Rama Scales, PA-C  guaifenesin (HUMIBID E) 400 MG TABS tablet Take 1 tablet 3 times daily as needed for chest congestion and cough 06/14/22   Theadora Rama Scales, PA-C  montelukast (SINGULAIR) 10 MG tablet Take 1 tablet (10 mg total) by mouth at bedtime. 06/14/22 12/11/22  Theadora Rama Scales, PA-C  ondansetron (ZOFRAN-ODT) 4 MG disintegrating tablet Take 1 tablet (4 mg total) by mouth every 8 (eight) hours as needed for nausea or vomiting. 06/14/22   Theadora Rama Scales, PA-C  promethazine-dextromethorphan (PROMETHAZINE-DM) 6.25-15 MG/5ML syrup Take 5 mLs by mouth 4 (four) times daily as needed for cough. 06/14/22   Theadora Rama Scales, PA-C      Allergies    Lactose intolerance (gi)    Review of Systems   Review of Systems  All other systems reviewed and are negative.   Physical Exam Updated  Vital Signs BP 121/69 (BP Location: Left Arm)   Pulse 76   Temp 98 F (36.7 C) (Oral)   Resp 16   Ht 6' (1.829 m)   Wt 95.3 kg   SpO2 99%   BMI 28.48 kg/m  Physical Exam Vitals and nursing note reviewed.  Constitutional:      General: He is not in acute distress.    Appearance: Normal appearance. He is well-developed.  HENT:     Head: Normocephalic and atraumatic.  Eyes:     Conjunctiva/sclera: Conjunctivae normal.     Pupils: Pupils are equal, round, and reactive to light.  Cardiovascular:     Rate and Rhythm: Normal rate and regular rhythm.     Heart sounds: Normal heart sounds.  Pulmonary:     Effort: Pulmonary effort is normal. No respiratory distress.     Breath sounds: Normal breath sounds.  Abdominal:     General: There is no distension.     Palpations: Abdomen is soft.     Tenderness: There is no abdominal tenderness.  Musculoskeletal:        General: Tenderness present. No deformity. Normal range of motion.     Cervical back: Normal range of motion and neck supple.     Comments: Full active range of motion noted of the right elbow.  Minimal tenderness along the right lateral elbow.  Distal right upper extremity is neurovascular tact.  Skin:    General:  Skin is warm and dry.  Neurological:     General: No focal deficit present.     Mental Status: He is alert and oriented to person, place, and time.     ED Results / Procedures / Treatments   Labs (all labs ordered are listed, but only abnormal results are displayed) Labs Reviewed - No data to display  EKG None  Radiology DG Elbow Complete Right  Result Date: 12/09/2022 CLINICAL DATA:  Elbow pain EXAM: RIGHT ELBOW - COMPLETE 4 VIEW COMPARISON:  None Available. FINDINGS: There is no evidence of fracture, dislocation, or joint effusion. There is no evidence of arthropathy or other focal bone abnormality. Soft tissues are unremarkable. IMPRESSION: No acute osseous abnormality Electronically Signed   By: Karen Kays M.D.   On: 12/09/2022 12:53    Procedures Procedures    Medications Ordered in ED Medications - No data to display  ED Course/ Medical Decision Making/ A&P                             Medical Decision Making Amount and/or Complexity of Data Reviewed Radiology: ordered.    Medical Screen Complete  This patient presented to the ED with complaint of right elbow injury.  This complaint involves an extensive number of treatment options. The initial differential diagnosis includes, but is not limited to, fracture versus strain versus contusion  This presentation is: Acute, Self-Limited, and Previously Undiagnosed  Patient is presenting with complaint of minor injury to the right elbow that occurred yesterday.  Obtained imaging is without acute abnormality.  Patient reassured by imaging results and ED evaluation.  Importance of close follow-up was stressed.  Strict return precautions given and understood.   Additional history obtained:   External records from outside sources obtained and reviewed including prior ED visits and prior Inpatient records.   Imaging Studies ordered:  I ordered imaging studies including right elbow I independently visualized and interpreted obtained imaging which showed NAD I agree with the radiologist interpretation.  Problem List / ED Course:  Right elbow contusion   Reevaluation:  After the interventions noted above, I reevaluated the patient and found that they have: improved    Disposition:  After consideration of the diagnostic results and the patients response to treatment, I feel that the patent would benefit from close outpatient follow-up.          Final Clinical Impression(s) / ED Diagnoses Final diagnoses:  Contusion of right elbow, initial encounter    Rx / DC Orders ED Discharge Orders     None         Wynetta Fines, MD 12/09/22 [phone]

## 2023-01-31 ENCOUNTER — Other Ambulatory Visit: Payer: Self-pay

## 2023-01-31 ENCOUNTER — Emergency Department (HOSPITAL_COMMUNITY)
Admission: EM | Admit: 2023-01-31 | Discharge: 2023-02-01 | Disposition: A | Discharge status: Home / Self Care | Payer: MEDICAID | Attending: Emergency Medicine | Admitting: Emergency Medicine

## 2023-01-31 ENCOUNTER — Encounter (HOSPITAL_COMMUNITY): Payer: Self-pay

## 2023-01-31 DIAGNOSIS — S3992XA Unspecified injury of lower back, initial encounter: Secondary | ICD-10-CM | POA: Diagnosis present

## 2023-01-31 DIAGNOSIS — X500XXA Overexertion from strenuous movement or load, initial encounter: Secondary | ICD-10-CM | POA: Insufficient documentation

## 2023-01-31 DIAGNOSIS — Y990 Civilian activity done for income or pay: Secondary | ICD-10-CM | POA: Insufficient documentation

## 2023-01-31 DIAGNOSIS — S39012A Strain of muscle, fascia and tendon of lower back, initial encounter: Secondary | ICD-10-CM | POA: Diagnosis not present

## 2023-01-31 NOTE — ED Triage Notes (Signed)
Lower back pain x 3-4 days. Says he was wrapping a pallet at work and tripped.   Describes pain as sharp spasms that radiate down left leg.   Denies bowel or bladder changes.

## 2023-02-01 ENCOUNTER — Emergency Department (HOSPITAL_COMMUNITY): Payer: MEDICAID

## 2023-02-01 MED ORDER — METHYLPREDNISOLONE ACETATE 40 MG/ML IJ SUSP
40.0000 mg | Freq: Once | INTRAMUSCULAR | Status: AC
  Administered 2023-02-01: 40 mg via INTRAMUSCULAR
  Filled 2023-02-01: qty 1

## 2023-02-01 MED ORDER — PREDNISONE 10 MG (21) PO TBPK: ORAL_TABLET | Freq: Every day | ORAL | 0 refills | Status: AC

## 2023-02-01 MED ORDER — KETOROLAC TROMETHAMINE 15 MG/ML IJ SOLN
15.0000 mg | Freq: Once | INTRAMUSCULAR | Status: AC
  Administered 2023-02-01: 15 mg via INTRAMUSCULAR
  Filled 2023-02-01: qty 1

## 2023-02-01 MED ORDER — METHOCARBAMOL 500 MG PO TABS: 500.0000 mg | ORAL_TABLET | Freq: Two times a day (BID) | ORAL | 0 refills | Status: AC

## 2023-02-01 NOTE — Discharge Instructions (Signed)
Prednisone as prescribed starting Thursday as prescribed. Robaxin as needed as prescribed. Do NOT drive or operate machinery while taking Robaxin. Continue with topical pain patches as needed.  Follow up with a primary care provider if symptoms persist.

## 2023-02-01 NOTE — ED Provider Notes (Signed)
[place] EMERGENCY DEPARTMENT AT [facility] Provider Note   CSN: [id number] Arrival date & time: 01/31/23  2311     History  Chief Complaint  Patient presents with   Back Pain    Tom [name] is a 22 y.o. male.  22 year old male presents emergency room with complaint of lower back pain.  Patient states that at work on Friday (5 days ago) he was leaning over wrapping a pallet when he felt a pain in his left lower back.  He states the pain radiates down the lateral aspect of his left leg with numbness to the lateral aspect of the leg.  Pain is worse with movement.  Patient is applied physical therapy tape as well as lidocaine patches without significant relief.  States that after this incident at work, he continued to aggravate his back with activities over the following days which were not work-related, does not plan on notifying his job of this injury.  Denies abdominal pain, saddle paresthesias, changes in bowel or bladder habits.  Reports pulling his back in the past, never requiring physical therapy or surgical intervention.       Home Medications Prior to Admission medications   Medication Sig Start Date End Date Taking? Authorizing Provider  albuterol (VENTOLIN HFA) 108 (90 Base) MCG/ACT inhaler Inhale 2 puffs into the lungs every 6 (six) hours as needed for wheezing or shortness of breath (Cough). 06/14/22  Yes Theadora Rama Scales, PA-C  methocarbamol (ROBAXIN) 500 MG tablet Take 1 tablet (500 mg total) by mouth 2 (two) times daily. 02/01/23  Yes Jeannie Fend, PA-C  predniSONE (STERAPRED UNI-PAK 21 TAB) 10 MG (21) TBPK tablet Take by mouth daily. Take 6 tabs by mouth daily  for 2 days, then 5 tabs for 2 days, then 4 tabs for 2 days, then 3 tabs for 2 days, 2 tabs for 2 days, then 1 tab by mouth daily for 2 days 02/01/23  Yes Jeannie Fend, PA-C      Allergies    Lactose intolerance (gi)    Review of Systems   Review of Systems Negative except as per  HPI Physical Exam Updated Vital Signs BP 132/68   Pulse 68   Temp 98.3 F (36.8 C) (Oral)   Resp 16   Ht 6\' 1"  (1.854 m)   Wt 97.5 kg   SpO2 99%   BMI 28.37 kg/m  Physical Exam Vitals and nursing note reviewed.  Constitutional:      General: He is not in acute distress.    Appearance: He is well-developed. He is not diaphoretic.  HENT:     Head: Normocephalic and atraumatic.  Cardiovascular:     Pulses: Normal pulses.  Pulmonary:     Effort: Pulmonary effort is normal.  Abdominal:     Palpations: Abdomen is soft.     Tenderness: There is no abdominal tenderness. There is no guarding.  Musculoskeletal:        General: No tenderness.     Lumbar back: Positive left straight leg raise test. Negative right straight leg raise test.     Right lower leg: No edema.     Left lower leg: No edema.  Skin:    General: Skin is warm and dry.     Findings: No erythema or rash.  Neurological:     Mental Status: He is alert and oriented to person, place, and time.     Sensory: No sensory deficit.     Motor: No  weakness.     Gait: Gait normal.     Deep Tendon Reflexes:     Reflex Scores:      Patellar reflexes are 2+ on the right side and 2+ on the left side. Psychiatric:        Behavior: Behavior normal.     ED Results / Procedures / Treatments   Labs (all labs ordered are listed, but only abnormal results are displayed) Labs Reviewed - No data to display  EKG None  Radiology DG Lumbar Spine Complete  Result Date: 02/01/2023 CLINICAL DATA:  Fall 3 days ago with low back pain, initial encounter EXAM: LUMBAR SPINE - COMPLETE 4+ VIEW COMPARISON:  01/13/2020 FINDINGS: Vertebral body height is well maintained. No pars defects are noted. No anterolisthesis is seen. No soft tissue abnormality is noted. IMPRESSION: No acute abnormality noted. Electronically Signed   By: Alcide Clever M.D.   On: 02/01/2023 00:21    Procedures Procedures    Medications Ordered in ED Medications   methylPREDNISolone acetate (DEPO-MEDROL) injection 40 mg (40 mg Intramuscular Given 02/01/23 0041)  ketorolac (TORADOL) 15 MG/ML injection 15 mg (15 mg Intramuscular Given 02/01/23 0041)    ED Course/ Medical Decision Making/ A&P                                 Medical Decision Making Amount and/or Complexity of Data Reviewed Radiology: ordered.  Risk Prescription drug management.   22 year old male with left lower back pain with radiculopathy. No red flags. Pain reproduced with straight leg raise on left. Reflexes symmetric, gait intact. Provided with IM depomedrol and toradol in the ER. Dc with prednisone taper and robaxin. Recommend recheck with PCP. Return to ER as needed.  XR lumbar spine as ordered and interpreted by myself as negative for acute abnormality, agree with radiologist interpretation.         Final Clinical Impression(s) / ED Diagnoses Final diagnoses:  Strain of lumbar region, initial encounter    Rx / DC Orders ED Discharge Orders          Ordered    predniSONE (STERAPRED UNI-PAK 21 TAB) 10 MG (21) TBPK tablet  Daily        02/01/23 0038    methocarbamol (ROBAXIN) 500 MG tablet  2 times daily        02/01/23 0038              Jeannie Fend, PA-C 02/01/23 0146    Dione Booze, MD 02/01/23 [phone]

## 2023-07-05 ENCOUNTER — Ambulatory Visit (HOSPITAL_COMMUNITY): Payer: Self-pay | Admitting: Licensed Clinical Social Worker

## 2023-07-14 ENCOUNTER — Encounter (HOSPITAL_COMMUNITY): Payer: Self-pay | Admitting: Emergency Medicine

## 2023-07-14 ENCOUNTER — Emergency Department (HOSPITAL_COMMUNITY)
Admission: EM | Admit: 2023-07-14 | Discharge: 2023-07-15 | Disposition: A | Discharge status: Home / Self Care | Payer: MEDICAID | Attending: Emergency Medicine | Admitting: Emergency Medicine

## 2023-07-14 ENCOUNTER — Other Ambulatory Visit: Payer: Self-pay

## 2023-07-14 DIAGNOSIS — B974 Respiratory syncytial virus as the cause of diseases classified elsewhere: Secondary | ICD-10-CM | POA: Diagnosis not present

## 2023-07-14 DIAGNOSIS — Z7952 Long term (current) use of systemic steroids: Secondary | ICD-10-CM | POA: Insufficient documentation

## 2023-07-14 DIAGNOSIS — Z20822 Contact with and (suspected) exposure to covid-19: Secondary | ICD-10-CM | POA: Insufficient documentation

## 2023-07-14 DIAGNOSIS — B338 Other specified viral diseases: Secondary | ICD-10-CM

## 2023-07-14 DIAGNOSIS — Z7951 Long term (current) use of inhaled steroids: Secondary | ICD-10-CM | POA: Diagnosis not present

## 2023-07-14 DIAGNOSIS — R059 Cough, unspecified: Secondary | ICD-10-CM | POA: Diagnosis present

## 2023-07-14 DIAGNOSIS — J45909 Unspecified asthma, uncomplicated: Secondary | ICD-10-CM | POA: Diagnosis not present

## 2023-07-14 DIAGNOSIS — J988 Other specified respiratory disorders: Secondary | ICD-10-CM | POA: Insufficient documentation

## 2023-07-14 LAB — RESP PANEL BY RT-PCR (RSV, FLU A&B, COVID)  RVPGX2
Influenza A by PCR: NEGATIVE
Influenza B by PCR: NEGATIVE
Resp Syncytial Virus by PCR: POSITIVE — AB
SARS Coronavirus 2 by RT PCR: NEGATIVE

## 2023-07-14 NOTE — ED Triage Notes (Signed)
Patient c/o flu like symptoms x 3 days. Patient denies fever. Patient denies N/V. Patient OTC medication without relief.

## 2023-07-15 NOTE — ED Provider Notes (Signed)
[place] EMERGENCY DEPARTMENT AT [facility] Provider Note   CSN: [id number] Arrival date & time: 07/14/23  1856     History  Chief Complaint  Patient presents with   Flu like symptoms    Tom [name] is a 23 y.o. male.  Patient presents to the emergency department complaining of cough, congestion for 3 days. Patient also complains of left ear fullness with "blood" noticed on a q-tip. Patient denies fever, nausea, vomiting, diarrhea, chest pain, shortness of breath. Past medical history significant for asthma, bipolar I disorder  HPI     Home Medications Prior to Admission medications   Medication Sig Start Date End Date Taking? Authorizing Provider  albuterol (VENTOLIN HFA) 108 (90 Base) MCG/ACT inhaler Inhale 2 puffs into the lungs every 6 (six) hours as needed for wheezing or shortness of breath (Cough). 06/14/22   Theadora Rama Scales, PA-C  methocarbamol (ROBAXIN) 500 MG tablet Take 1 tablet (500 mg total) by mouth 2 (two) times daily. 02/01/23   Jeannie Fend, PA-C  predniSONE (STERAPRED UNI-PAK 21 TAB) 10 MG (21) TBPK tablet Take by mouth daily. Take 6 tabs by mouth daily  for 2 days, then 5 tabs for 2 days, then 4 tabs for 2 days, then 3 tabs for 2 days, 2 tabs for 2 days, then 1 tab by mouth daily for 2 days 02/01/23   Jeannie Fend, PA-C      Allergies    Lactose intolerance (gi)    Review of Systems   Review of Systems  Physical Exam Updated Vital Signs BP (!) 141/82   Pulse 77   Temp 98.4 F (36.9 C) (Oral)   Resp 14   Ht 6\' 1"  (1.854 m)   Wt 97.5 kg   SpO2 99%   BMI 28.36 kg/m  Physical Exam Vitals and nursing note reviewed.  Constitutional:      General: He is not in acute distress.    Appearance: He is well-developed.  HENT:     Head: Normocephalic and atraumatic.     Left Ear: Tympanic membrane normal.     Ears:     Comments: Small amount of blood at external meatus of left ear. Left TM normal.  Eyes:      Conjunctiva/sclera: Conjunctivae normal.  Cardiovascular:     Rate and Rhythm: Normal rate and regular rhythm.  Pulmonary:     Effort: Pulmonary effort is normal. No respiratory distress.     Breath sounds: Normal breath sounds.  Abdominal:     Palpations: Abdomen is soft.     Tenderness: There is no abdominal tenderness.  Musculoskeletal:        General: No swelling.     Cervical back: Neck supple.  Skin:    General: Skin is warm and dry.     Capillary Refill: Capillary refill takes less than 2 seconds.  Neurological:     Mental Status: He is alert.  Psychiatric:        Mood and Affect: Mood normal.     ED Results / Procedures / Treatments   Labs (all labs ordered are listed, but only abnormal results are displayed) Labs Reviewed  RESP PANEL BY RT-PCR (RSV, FLU A&B, COVID)  RVPGX2 - Abnormal; Notable for the following components:      Result Value   Resp Syncytial Virus by PCR POSITIVE (*)    All other components within normal limits    EKG None  Radiology No results found.  Procedures  Procedures    Medications Ordered in ED Medications - No data to display  ED Course/ Medical Decision Making/ A&P                                 Medical Decision Making  This patient presents to the ED for concern of respiratory symptoms, this involves an extensive number of treatment options, and is a complaint that carries with it a high risk of complications and morbidity.  The differential diagnosis includes Covid, Flu, RSV, others   Co morbidities that complicate the patient evaluation  Bipolar I disorder   Lab Tests:  I Ordered, and personally interpreted labs.  The pertinent results include:  RSV positive   Test / Admission - Considered:  Patient positive for RSV. External meatus of left ear with dried blood, minor injury noted with no active bleeding. No signs of external otitis or otitis media. Plan to discharge home with recommendations for supportive care.           Final Clinical Impression(s) / ED Diagnoses Final diagnoses:  RSV infection    Rx / DC Orders ED Discharge Orders     None         Pamala Duffel 07/15/23 0121    Palumbo, April, MD 07/15/23 [phone]

## 2023-07-15 NOTE — Discharge Instructions (Signed)
You tested positive tonight for RSV. This is typically a self limiting virus. You may use over the counter medication at home for symptom relief. Return to the emergency department if you develop any life threatening symptoms.

## 2023-07-17 ENCOUNTER — Other Ambulatory Visit: Payer: Self-pay

## 2023-07-17 ENCOUNTER — Emergency Department (HOSPITAL_BASED_OUTPATIENT_CLINIC_OR_DEPARTMENT_OTHER)
Admission: EM | Admit: 2023-07-17 | Discharge: 2023-07-17 | Disposition: A | Discharge status: Home / Self Care | Payer: MEDICAID | Attending: Emergency Medicine | Admitting: Emergency Medicine

## 2023-07-17 ENCOUNTER — Emergency Department (HOSPITAL_BASED_OUTPATIENT_CLINIC_OR_DEPARTMENT_OTHER): Payer: MEDICAID | Admitting: Radiology

## 2023-07-17 ENCOUNTER — Encounter (HOSPITAL_BASED_OUTPATIENT_CLINIC_OR_DEPARTMENT_OTHER): Payer: Self-pay | Admitting: Emergency Medicine

## 2023-07-17 DIAGNOSIS — B974 Respiratory syncytial virus as the cause of diseases classified elsewhere: Secondary | ICD-10-CM | POA: Insufficient documentation

## 2023-07-17 DIAGNOSIS — B338 Other specified viral diseases: Secondary | ICD-10-CM

## 2023-07-17 DIAGNOSIS — R0602 Shortness of breath: Secondary | ICD-10-CM | POA: Insufficient documentation

## 2023-07-17 DIAGNOSIS — Z20822 Contact with and (suspected) exposure to covid-19: Secondary | ICD-10-CM | POA: Insufficient documentation

## 2023-07-17 DIAGNOSIS — Z7951 Long term (current) use of inhaled steroids: Secondary | ICD-10-CM | POA: Insufficient documentation

## 2023-07-17 DIAGNOSIS — R197 Diarrhea, unspecified: Secondary | ICD-10-CM | POA: Diagnosis not present

## 2023-07-17 DIAGNOSIS — R0789 Other chest pain: Secondary | ICD-10-CM | POA: Diagnosis not present

## 2023-07-17 DIAGNOSIS — R519 Headache, unspecified: Secondary | ICD-10-CM | POA: Insufficient documentation

## 2023-07-17 DIAGNOSIS — H9192 Unspecified hearing loss, left ear: Secondary | ICD-10-CM | POA: Diagnosis not present

## 2023-07-17 DIAGNOSIS — J45909 Unspecified asthma, uncomplicated: Secondary | ICD-10-CM | POA: Insufficient documentation

## 2023-07-17 DIAGNOSIS — R059 Cough, unspecified: Secondary | ICD-10-CM | POA: Diagnosis not present

## 2023-07-17 MED ORDER — LIDOCAINE VISCOUS HCL 2 % MT SOLN
15.0000 mL | Freq: Once | OROMUCOSAL | Status: AC
  Administered 2023-07-17: 15 mL via ORAL
  Filled 2023-07-17: qty 15

## 2023-07-17 MED ORDER — ALUM & MAG HYDROXIDE-SIMETH 200-200-20 MG/5ML PO SUSP
30.0000 mL | Freq: Once | ORAL | Status: AC
  Administered 2023-07-17: 30 mL via ORAL
  Filled 2023-07-17: qty 30

## 2023-07-17 MED ORDER — ACETAMINOPHEN 325 MG PO TABS
975.0000 mg | ORAL_TABLET | Freq: Once | ORAL | Status: AC
  Administered 2023-07-17: 975 mg via ORAL
  Filled 2023-07-17: qty 3

## 2023-07-17 MED ORDER — IPRATROPIUM-ALBUTEROL 0.5-2.5 (3) MG/3ML IN SOLN
3.0000 mL | Freq: Once | RESPIRATORY_TRACT | Status: AC
  Administered 2023-07-17: 3 mL via RESPIRATORY_TRACT
  Filled 2023-07-17: qty 3

## 2023-07-17 MED ORDER — AEROCHAMBER PLUS FLO-VU MEDIUM MISC
1.0000 | Freq: Once | Status: DC
  Filled 2023-07-17: qty 1

## 2023-07-17 MED ORDER — ALBUTEROL SULFATE HFA 108 (90 BASE) MCG/ACT IN AERS
2.0000 | INHALATION_SPRAY | RESPIRATORY_TRACT | Status: DC | PRN
  Administered 2023-07-17: 2 via RESPIRATORY_TRACT
  Filled 2023-07-17: qty 6.7

## 2023-07-17 MED ORDER — OFLOXACIN 0.3 % OT SOLN: 3.0000 [drp] | Freq: Two times a day (BID) | OTIC | 0 refills | Status: AC

## 2023-07-17 NOTE — Discharge Instructions (Addendum)
Thank you for letting us evaluate you today.  EKG was similar to prior.  We gave you a breathing treatment, Tylenol, GI cocktail for your symptoms which significantly improved.  I have also provided you with PCP follow-up so you can establish care.  ENT follow-up for hearing loss and possible perforation.  I have sent ofloxacin to your pharmacy  Return to emergency department if you experience significant shortness of breath, wheezing, chest pain.

## 2023-07-17 NOTE — ED Triage Notes (Signed)
Diagnosed with RSV Saturday. C/o congestion and bleeding from L ear with pain.

## 2023-07-17 NOTE — ED Provider Notes (Signed)
[place] EMERGENCY DEPARTMENT AT [facility] Provider Note   CSN: [id number] Arrival date & time: 07/17/23  1059     History  Chief Complaint  Patient presents with   Nasal Congestion    Tom [name] is a 23 y.o. 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 He also reports one episode of chest tightness that woke him up this morning 0300  He has had nasal congestion, coughing, fever, HA, diarrhea (2x a day) that started started mid last week. Was evaluated on 07/14/23 and diagnosed with RSV.  Currently complains of epigastric tightness and SOB at rest. He also reports decreased hearing from left ear since being evaluated. He states that he covered his nose and blew to "release pressure" in his head.  HPI     Home Medications Prior to Admission medications   Medication Sig Start Date End Date Taking? Authorizing Provider  ofloxacin (FLOXIN) 0.3 % OTIC solution Place 3 drops into the left ear 2 (two) times daily for 7 days. 07/17/23 07/24/23 Yes Judithann Sheen, PA  albuterol (VENTOLIN HFA) 108 (90 Base) MCG/ACT inhaler Inhale 2 puffs into the lungs every 6 (six) hours as needed for wheezing or shortness of breath (Cough). 06/14/22   Theadora Rama Scales, PA-C  methocarbamol (ROBAXIN) 500 MG tablet Take 1 tablet (500 mg total) by mouth 2 (two) times daily. 02/01/23   Jeannie Fend, PA-C  predniSONE (STERAPRED UNI-PAK 21 TAB) 10 MG (21) TBPK tablet Take by mouth daily. Take 6 tabs by mouth daily  for 2 days, then 5 tabs for 2 days, then 4 tabs for 2 days, then 3 tabs for 2 days, 2 tabs for 2 days, then 1 tab by mouth daily for 2 days 02/01/23   Jeannie Fend, PA-C      Allergies    Lactose intolerance (gi)    Review of Systems   Review of Systems  Constitutional:  Negative for chills, fatigue and fever.   Respiratory:  Negative for cough, chest tightness, shortness of breath and wheezing.   Cardiovascular:  Negative for chest pain and palpitations.  Gastrointestinal:  Negative for abdominal pain, constipation, diarrhea, nausea and vomiting.  Neurological:  Negative for dizziness, seizures, weakness, light-headedness, numbness and headaches.    Physical Exam Updated Vital Signs BP 137/71   Pulse 77   Temp 98.1 F (36.7 C) (Oral)   Resp 14   SpO2 100%  Physical Exam Vitals and nursing note reviewed.  Constitutional:      General: He is not in acute distress.    Appearance: Normal appearance.  HENT:     Head: Normocephalic and atraumatic.     Right Ear: Hearing, ear canal and external ear normal. There is impacted cerumen. No foreign body. No mastoid tenderness.     Left Ear: Ear canal and external ear normal. Decreased hearing noted. No foreign body. No mastoid tenderness.  Eyes:     Conjunctiva/sclera: Conjunctivae normal.  Cardiovascular:     Rate and Rhythm: Normal rate.  Pulmonary:     Effort: Pulmonary effort is normal. No tachypnea, accessory muscle usage or respiratory distress.     Breath sounds: Decreased breath sounds present.     Comments: RR 16 breath/min. Speaking in full and complete sentences without difficulty  Following duoneb, lung sound CTAB Musculoskeletal:  Arms:     Comments: Reproducible TTP. Worsens with palpation, deep inspiration, and coughing  Skin:    General: Skin is warm.     Capillary Refill: Capillary refill takes less than 2 seconds.     Coloration: Skin is not jaundiced or pale.  Neurological:     Mental Status: He is alert and oriented to person, place, and time. Mental status is at baseline.     ED Results / Procedures / Treatments   Labs (all labs ordered are listed, but only abnormal results are displayed) Labs Reviewed - No data to display  EKG EKG Interpretation Date/Time:  Monday July 17 2023 14:45:23  EST Ventricular Rate:  54 PR Interval:  165 QRS Duration:  109 QT Interval:  382 QTC Calculation: 362 R Axis:   86  Text Interpretation: Sinus rhythm ST elev, probable normal early repol pattern No significant change since last tracing Confirmed by Cathren Laine ([id number]) on 07/17/2023 2:48:11 PM  Radiology DG Chest 2 View Result Date: 07/17/2023 CLINICAL DATA:  Shortness of breath. EXAM: CHEST - 2 VIEW COMPARISON:  09/25/2022. FINDINGS: Bilateral lung fields are clear. Bilateral costophrenic angles are clear. Normal cardio-mediastinal silhouette. No acute osseous abnormalities. The soft tissues are within normal limits. IMPRESSION: No active cardiopulmonary disease. Electronically Signed   By: Jules Schick M.D.   On: 07/17/2023 15:01    Procedures Procedures    Medications Ordered in ED Medications  AeroChamber Plus Flo-Vu Medium MISC 1 each (has no administration in time range)  albuterol (VENTOLIN HFA) 108 (90 Base) MCG/ACT inhaler 2 puff (2 puffs Inhalation Given 07/17/23 1351)  ipratropium-albuterol (DUONEB) 0.5-2.5 (3) MG/3ML nebulizer solution 3 mL (3 mLs Nebulization Given 07/17/23 1351)  acetaminophen (TYLENOL) tablet 975 mg (975 mg Oral Given 07/17/23 1603)  alum & mag hydroxide-simeth (MAALOX/MYLANTA) 200-200-20 MG/5ML suspension 30 mL (30 mLs Oral Given 07/17/23 1604)    And  lidocaine (XYLOCAINE) 2 % viscous mouth solution 15 mL (15 mLs Oral Given 07/17/23 1604)    ED Course/ Medical Decision Making/ A&P                                 Medical Decision Making Amount and/or Complexity of Data Reviewed Radiology: ordered.  Risk OTC drugs. Prescription drug management.   Patient presents to the ED for concern of left ear bleeding and decreased hearing as well as chest tightness and SOB, this involves an extensive number of treatment options, and is a complaint that carries with it a high risk of complications and morbidity.  The differential diagnosis for chest  tightness and shortness of breath includes pain from RSV infection, Asthma exacerbation, GERD.  Less likely ACS, pneumonia, PE.  The differential diagnosis for ear bleeding and decreased hearing is AOM, TM perforation, effusion, barotrauma, laceration to ear   Co morbidities that complicate the patient evaluation  See HPI   Additional history obtained:  Additional history obtained from  [facility], Nursing, and Outside Medical Records   External records from outside source obtained and reviewed including  Girlfriend at bedside Triage RN note ED visit from 07/15/2023 Diagnosed with RSV.  Negative workup for PE     Imaging Studies ordered:  I ordered imaging studies including CXR  I independently visualized and interpreted imaging which showed no acute cardiopulmonary disease I agree with the radiologist interpretation   Cardiac Monitoring:  The patient was maintained on a cardiac monitor.  I personally  viewed and interpreted the cardiac monitored which showed an underlying rhythm of: NSR.  Early repol pattern that is unchanged from prior   Medicines ordered and prescription drug management:  I ordered medication including tylenol, gi cocktail, ofloxacin  for pain, TM rupture  Reevaluation of the patient after these medicines showed that the patient improved I have reviewed the patients home medicines and have made adjustments as needed   Test Considered:  Troponin Considered troponin however, with concurrent RSV infection, no risk factors, young age, non convincing story, unchanged EKG, improvement following duoneb, and reproducible pain, I do not feel that this is ACS related Shared decision making is had with patient regarding not obtaining trop    Problem List / ED Course:  Chest tightness Initially, lung sounds are decreased in all lobes.  Likely due to history of asthma and concurrent RSV infection Chest x-ray negative for pneumonia. EKG similar to prior When asked  to pinpoint location of chest tightness, patient points to epigastric area.  This pain is reproducible with palpation - will provide tylenol for pain Following Tylenol, GI cocktail, and DuoNeb, patient reports significant improvement of pain Discussed return to emerged part precautions with patient expressed understanding agrees with plan.  All questions answered to her satisfaction.  He is agreeable to discharge. RSV Diagnosed on 07/15/23 Decreased hearing of left ear Reported blood on q tip two days ago Possible TM rupture possibly due to patient self-inflicted barotrauma by closing his nose and blowing as hard as possible Will provide ofloxacin drops and ENT referral   Reevaluation:  After the interventions noted above, I reevaluated the patient and found that they have :resolved   Social Determinants of Health:  Has f/u   Dispostion:  After consideration of the diagnostic results and the patients response to treatment, I feel that the patent would benefit from outpatient management.   Discussed patient with Dr. Denton Lank who agrees with plan. He individually assessed patient and agrees with plan Final Clinical Impression(s) / ED Diagnoses Final diagnoses:  Decreased hearing of left ear  Shortness of breath  RSV (respiratory syncytial virus infection)    Rx / DC Orders ED Discharge Orders          Ordered    ofloxacin (FLOXIN) 0.3 % OTIC solution  2 times daily        07/17/23 1637              Judithann Sheen, PA 07/17/23 1643    Cathren Laine, MD 07/18/23 1002

## 2023-08-16 ENCOUNTER — Emergency Department (HOSPITAL_BASED_OUTPATIENT_CLINIC_OR_DEPARTMENT_OTHER): Payer: MEDICAID | Admitting: Radiology

## 2023-08-16 ENCOUNTER — Encounter (HOSPITAL_BASED_OUTPATIENT_CLINIC_OR_DEPARTMENT_OTHER): Payer: Self-pay

## 2023-08-16 ENCOUNTER — Other Ambulatory Visit: Payer: Self-pay

## 2023-08-16 ENCOUNTER — Emergency Department (HOSPITAL_BASED_OUTPATIENT_CLINIC_OR_DEPARTMENT_OTHER)
Admission: EM | Admit: 2023-08-16 | Discharge: 2023-08-16 | Disposition: A | Discharge status: Home / Self Care | Payer: MEDICAID | Attending: Emergency Medicine | Admitting: Emergency Medicine

## 2023-08-16 DIAGNOSIS — W1830XA Fall on same level, unspecified, initial encounter: Secondary | ICD-10-CM | POA: Diagnosis not present

## 2023-08-16 DIAGNOSIS — S63502A Unspecified sprain of left wrist, initial encounter: Secondary | ICD-10-CM | POA: Insufficient documentation

## 2023-08-16 DIAGNOSIS — Y93K1 Activity, walking an animal: Secondary | ICD-10-CM | POA: Insufficient documentation

## 2023-08-16 DIAGNOSIS — Z79899 Other long term (current) drug therapy: Secondary | ICD-10-CM | POA: Diagnosis not present

## 2023-08-16 MED ORDER — IBUPROFEN 400 MG PO TABS
600.0000 mg | ORAL_TABLET | Freq: Once | ORAL | Status: AC
  Administered 2023-08-16: 600 mg via ORAL
  Filled 2023-08-16: qty 1

## 2023-08-16 NOTE — ED Triage Notes (Signed)
 States yesterday when walking dog tripped and fell onto left hand and arm.  Pain and swelling to left hand and wrist.

## 2023-08-16 NOTE — Discharge Instructions (Addendum)
 Follow up with Dr. Kerry Fort at the [facility] [place] in about a week.  Wear the wrist brace until you are cleared by them.

## 2023-08-16 NOTE — ED Notes (Signed)
 Dc instructions reviewed with patient. Patient voiced understanding. Dc with belongings.

## 2023-08-16 NOTE — ED Provider Notes (Signed)
 [place] EMERGENCY DEPARTMENT AT [facility] Provider Note   CSN: [id number] Arrival date & time: 08/16/23  6213     History  Chief Complaint  Patient presents with   Hand Injury    Tom [name] is a 23 y.o. male.  HPI 23 year old male presents with left hand/wrist pain on the thenar aspect since a fall yesterday.  He was walking his dog and fell.  He wrapped his arm last night but then had pain going up his arm and so he took that off.  He presents today for evaluation.  No numbness or weakness.  Took some ibuprofen around 1 AM.  Home Medications Prior to Admission medications   Medication Sig Start Date End Date Taking? Authorizing Provider  albuterol (VENTOLIN HFA) 108 (90 Base) MCG/ACT inhaler Inhale 2 puffs into the lungs every 6 (six) hours as needed for wheezing or shortness of breath (Cough). 06/14/22   Theadora Rama Scales, PA-C  methocarbamol (ROBAXIN) 500 MG tablet Take 1 tablet (500 mg total) by mouth 2 (two) times daily. 02/01/23   Jeannie Fend, PA-C  predniSONE (STERAPRED UNI-PAK 21 TAB) 10 MG (21) TBPK tablet Take by mouth daily. Take 6 tabs by mouth daily  for 2 days, then 5 tabs for 2 days, then 4 tabs for 2 days, then 3 tabs for 2 days, 2 tabs for 2 days, then 1 tab by mouth daily for 2 days 02/01/23   Jeannie Fend, PA-C      Allergies    Lactose intolerance (gi)    Review of Systems   Review of Systems  Musculoskeletal:  Positive for arthralgias and joint swelling.  Neurological:  Negative for weakness and numbness.    Physical Exam Updated Vital Signs BP (!) 134/93   Pulse 84   Temp 98.5 F (36.9 C)   Resp 16   Ht 6\' 1"  (1.854 m)   Wt 99.8 kg   SpO2 100%   BMI 29.03 kg/m  Physical Exam Vitals and nursing note reviewed.  Constitutional:      Appearance: He is well-developed.  HENT:     Head: Normocephalic and atraumatic.  Cardiovascular:     Rate and Rhythm: Normal rate and regular rhythm.     Pulses:          Radial  pulses are 2+ on the right side.  Pulmonary:     Effort: Pulmonary effort is normal.  Musculoskeletal:     Right wrist: Tenderness (radial side) present. Normal range of motion.     Right hand: Swelling (primarily thenar) and tenderness present. Normal range of motion.     Comments: Grossly normal sensation in right hand Grossly normal radial, ulnar, median nerve testing in left hand.  Skin:    General: Skin is warm and dry.  Neurological:     Mental Status: He is alert.     ED Results / Procedures / Treatments   Labs (all labs ordered are listed, but only abnormal results are displayed) Labs Reviewed - No data to display  EKG None  Radiology DG Wrist Complete Left Result Date: 08/16/2023 CLINICAL DATA:  Recent fall, left hand and wrist pain EXAM: LEFT WRIST - COMPLETE 3+ VIEW COMPARISON:  08/16/2023 FINDINGS: There is no evidence of fracture or dislocation. There is no evidence of arthropathy or other focal bone abnormality. Soft tissues are unremarkable. IMPRESSION: Negative. Electronically Signed   By: Judie Petit.  Shick M.D.   On: 08/16/2023 12:24   DG Hand Complete  Left Result Date: 08/16/2023 CLINICAL DATA:  Left hand pain and swelling for 1 day. Patient tripped and fell. EXAM: LEFT HAND - COMPLETE 3+ VIEW COMPARISON:  None Available. FINDINGS: No acute fracture or dislocation. No aggressive osseous lesion. No significant arthritis of imaged joints. No radiopaque foreign bodies. Soft tissues are within normal limits. IMPRESSION: No acute osseous abnormality of the left hand. Electronically Signed   By: Jules Schick M.D.   On: 08/16/2023 11:04    Procedures Procedures    Medications Ordered in ED Medications  ibuprofen (ADVIL) tablet 600 mg (600 mg Oral Given 08/16/23 1057)    ED Course/ Medical Decision Making/ A&P                                 Medical Decision Making Amount and/or Complexity of Data Reviewed Radiology: ordered and independent interpretation performed.     Details: Negative x-rays.   X-rays are unremarkable.  Neurovascular intact.  He does have some tenderness around the snuffbox though not specifically over the scaphoid.  It is hard to tell though and so we will put him in a thumb spica and have him follow-up with orthopedics if not improving.  Recommend NSAIDs and ice.  Discharged home with return precautions.        Final Clinical Impression(s) / ED Diagnoses Final diagnoses:  Sprain of left wrist, initial encounter    Rx / DC Orders ED Discharge Orders     None         Pricilla Loveless, MD 08/16/23 1432

## 2023-08-28 ENCOUNTER — Emergency Department (HOSPITAL_COMMUNITY)
Admission: EM | Admit: 2023-08-28 | Discharge: 2023-08-28 | Disposition: A | Discharge status: Home / Self Care | Payer: MEDICAID | Attending: Emergency Medicine | Admitting: Emergency Medicine

## 2023-08-28 DIAGNOSIS — W1840XD Slipping, tripping and stumbling without falling, unspecified, subsequent encounter: Secondary | ICD-10-CM | POA: Insufficient documentation

## 2023-08-28 DIAGNOSIS — S63502D Unspecified sprain of left wrist, subsequent encounter: Secondary | ICD-10-CM | POA: Diagnosis not present

## 2023-08-28 DIAGNOSIS — S6992XD Unspecified injury of left wrist, hand and finger(s), subsequent encounter: Secondary | ICD-10-CM | POA: Diagnosis present

## 2023-08-28 NOTE — ED Provider Notes (Signed)
 [place] EMERGENCY DEPARTMENT AT [facility] Provider Note   CSN: [id number] Arrival date & time: 08/28/23  0701     History  Chief Complaint  Patient presents with   Letter for School/Work    Tom [name] is a 23 y.o. male.  The history is provided by the patient.     Pt is a 23 y/o male who presents to the ED today complaining of needing work note saying he can return to work after L wrist sprain 2 weeks ago happening when he slipped while walking dog. Is here to be cleared for work.   Denies weakness, numbness, tingling, pain.   Home Medications Prior to Admission medications   Medication Sig Start Date End Date Taking? Authorizing Provider  albuterol (VENTOLIN HFA) 108 (90 Base) MCG/ACT inhaler Inhale 2 puffs into the lungs every 6 (six) hours as needed for wheezing or shortness of breath (Cough). 06/14/22   Theadora Rama Scales, PA-C  methocarbamol (ROBAXIN) 500 MG tablet Take 1 tablet (500 mg total) by mouth 2 (two) times daily. 02/01/23   Jeannie Fend, PA-C  predniSONE (STERAPRED UNI-PAK 21 TAB) 10 MG (21) TBPK tablet Take by mouth daily. Take 6 tabs by mouth daily  for 2 days, then 5 tabs for 2 days, then 4 tabs for 2 days, then 3 tabs for 2 days, 2 tabs for 2 days, then 1 tab by mouth daily for 2 days 02/01/23   Jeannie Fend, PA-C      Allergies    Lactose intolerance (gi)    Review of Systems   Review of Systems  All other systems reviewed and are negative.   Physical Exam Updated Vital Signs BP (!) 147/73 (BP Location: Left Arm)   Pulse 69   Temp 98.1 F (36.7 C) (Oral)   Resp 18   SpO2 94%  Physical Exam Vitals and nursing note reviewed.  Constitutional:      Appearance: Normal appearance.  HENT:     Head: Normocephalic and atraumatic.  Eyes:     General: No scleral icterus.       Right eye: No discharge.        Left eye: No discharge.     Extraocular Movements: Extraocular movements intact.     Conjunctiva/sclera:  Conjunctivae normal.  Cardiovascular:     Rate and Rhythm: Normal rate and regular rhythm.     Pulses: Normal pulses.     Heart sounds: Normal heart sounds. No murmur heard.    No friction rub. No gallop.  Pulmonary:     Effort: Pulmonary effort is normal. No respiratory distress.     Breath sounds: Normal breath sounds. No stridor. No wheezing, rhonchi or rales.  Abdominal:     General: Abdomen is flat.     Palpations: Abdomen is soft.     Tenderness: There is no abdominal tenderness.  Musculoskeletal:        General: No swelling, tenderness, deformity or signs of injury.     Cervical back: Normal range of motion.  Skin:    General: Skin is warm and dry.     Coloration: Skin is not jaundiced or pale.     Findings: No bruising, erythema or lesion.  Neurological:     General: No focal deficit present.     Mental Status: He is alert and oriented to person, place, and time. Mental status is at baseline.     Sensory: No sensory deficit.     Motor:  No weakness.  Psychiatric:        Mood and Affect: Mood normal.     ED Results / Procedures / Treatments   Labs (all labs ordered are listed, but only abnormal results are displayed) Labs Reviewed - No data to display  EKG None  Radiology No results found.  Procedures Procedures    Medications Ordered in ED Medications - No data to display  ED Course/ Medical Decision Making/ A&P                                 Medical Decision Making  Patient is a 23 year old male presents the ED today complaining of needing a work note saying he can return back to work after a left wrist sprain 2 weeks ago.  Denies any symptoms currently.  Physical exam is unremarkable with no signs of injury, normal ROM, normal sensation, normal strength.  Patient has no emergent pathology present at this time And is not complaining of any dose currently..  Will prescribe work note and discharge at this time. Differential diagnoses prior to  evaluation: Muscle strain, fracture, sprain, dislocation  Past Medical History / Social History / Additional history: Chart reviewed. Pertinent results include:  Previous seen on 2/26/125 for left wrist pain.   Medications / Treatment: No medication or treatment required for this visit.   Disposition: After consideration of the diagnostic results and the patients response to treatment, I feel that the patient benefit from discharge and treatment as above.   emergency department workup does not suggest an emergent condition requiring admission or immediate intervention beyond what has been performed at this time. The plan is: Provide work note, return for any new or worsening symptoms. The patient is safe for discharge and has been instructed to return immediately for worsening symptoms, change in symptoms or any other concerns.  Final Clinical Impression(s) / ED Diagnoses Final diagnoses:  Sprain of left wrist, subsequent encounter    Rx / DC Orders ED Discharge Orders     None         Lavonia Drafts 08/28/23 1038    Wynetta Fines, MD 08/28/23 [phone]

## 2023-08-28 NOTE — ED Triage Notes (Signed)
 Pt states that he was seen approximately two weeks ago for a L wrist injury. Was cleared by ED staff, but when he returned to work they requested a doctor's note. Pt denies any complaints during triage.

## 2023-08-28 NOTE — Discharge Instructions (Signed)
 You were seen today for left wrist pain.  No signs of injury and wrist appears to be well-healed.  Can return to work.
